# Patient Record
Sex: Female | Born: 1955 | Race: Black or African American | Hispanic: No | Marital: Married | Smoking: Current every day smoker
Health system: Southern US, Community
[De-identification: ages and names within clinical notes are randomized; demographics above are authoritative.]

## PROBLEM LIST (undated history)

## (undated) DIAGNOSIS — H409 Unspecified glaucoma: Secondary | ICD-10-CM

## (undated) DIAGNOSIS — H2013 Chronic iridocyclitis, bilateral: Secondary | ICD-10-CM

## (undated) DIAGNOSIS — K219 Gastro-esophageal reflux disease without esophagitis: Secondary | ICD-10-CM

## (undated) DIAGNOSIS — E119 Type 2 diabetes mellitus without complications: Secondary | ICD-10-CM

## (undated) DIAGNOSIS — E049 Nontoxic goiter, unspecified: Secondary | ICD-10-CM

## (undated) DIAGNOSIS — I10 Essential (primary) hypertension: Secondary | ICD-10-CM

## (undated) HISTORY — DX: Chronic iridocyclitis, bilateral: H20.13

## (undated) HISTORY — PX: NO PAST SURGERIES: SHX2092

## (undated) HISTORY — PX: COLONOSCOPY: SHX174

## (undated) HISTORY — DX: Type 2 diabetes mellitus without complications: E11.9

## (undated) HISTORY — DX: Unspecified glaucoma: H40.9

## (undated) HISTORY — PX: UPPER GASTROINTESTINAL ENDOSCOPY: SHX188

---

## 1999-05-14 ENCOUNTER — Encounter: Payer: Self-pay | Admitting: Internal Medicine

## 1999-05-14 ENCOUNTER — Ambulatory Visit (HOSPITAL_COMMUNITY): Admission: RE | Admit: 1999-05-14 | Discharge: 1999-05-14 | Payer: Self-pay | Admitting: Internal Medicine

## 1999-07-29 ENCOUNTER — Ambulatory Visit (HOSPITAL_COMMUNITY): Admission: RE | Admit: 1999-07-29 | Discharge: 1999-07-29 | Payer: Self-pay | Admitting: Internal Medicine

## 1999-07-29 ENCOUNTER — Encounter: Payer: Self-pay | Admitting: Internal Medicine

## 2000-09-09 ENCOUNTER — Other Ambulatory Visit: Admission: RE | Admit: 2000-09-09 | Discharge: 2000-09-09 | Payer: Self-pay | Admitting: Internal Medicine

## 2000-09-22 ENCOUNTER — Encounter: Payer: Self-pay | Admitting: Internal Medicine

## 2000-09-22 ENCOUNTER — Ambulatory Visit (HOSPITAL_COMMUNITY): Admission: RE | Admit: 2000-09-22 | Discharge: 2000-09-22 | Payer: Self-pay | Admitting: Internal Medicine

## 2002-11-12 ENCOUNTER — Emergency Department (HOSPITAL_COMMUNITY): Admission: EM | Admit: 2002-11-12 | Discharge: 2002-11-12 | Payer: Self-pay | Admitting: Emergency Medicine

## 2007-03-08 ENCOUNTER — Ambulatory Visit (HOSPITAL_COMMUNITY): Admission: RE | Admit: 2007-03-08 | Discharge: 2007-03-08 | Payer: Self-pay | Admitting: Internal Medicine

## 2008-03-20 ENCOUNTER — Ambulatory Visit (HOSPITAL_COMMUNITY): Admission: RE | Admit: 2008-03-20 | Discharge: 2008-03-20 | Payer: Self-pay | Admitting: Internal Medicine

## 2008-03-23 ENCOUNTER — Encounter: Admission: RE | Admit: 2008-03-23 | Discharge: 2008-03-23 | Payer: Self-pay | Admitting: Internal Medicine

## 2008-03-28 ENCOUNTER — Encounter (INDEPENDENT_AMBULATORY_CARE_PROVIDER_SITE_OTHER): Payer: Self-pay | Admitting: Diagnostic Radiology

## 2008-03-28 ENCOUNTER — Encounter: Admission: RE | Admit: 2008-03-28 | Discharge: 2008-03-28 | Payer: Self-pay | Admitting: Internal Medicine

## 2009-05-09 ENCOUNTER — Ambulatory Visit (HOSPITAL_COMMUNITY): Admission: RE | Admit: 2009-05-09 | Discharge: 2009-05-09 | Payer: Self-pay | Admitting: Internal Medicine

## 2009-11-23 IMAGING — MG MM DIAGNOSTIC LTD LEFT
3 series · 3 of 3 positions shown · non-contrast
Comparison: 03/20/2008, 03/08/2007

CLINICAL DATA: Screening callback for questioned left breast mass

DIGITAL DIAGNOSTIC  LEFT  MAMMOGRAM  WITHOUT CAD AND LEFT BREAST
ULTRASOUND:

[L CC (1 of 2)]
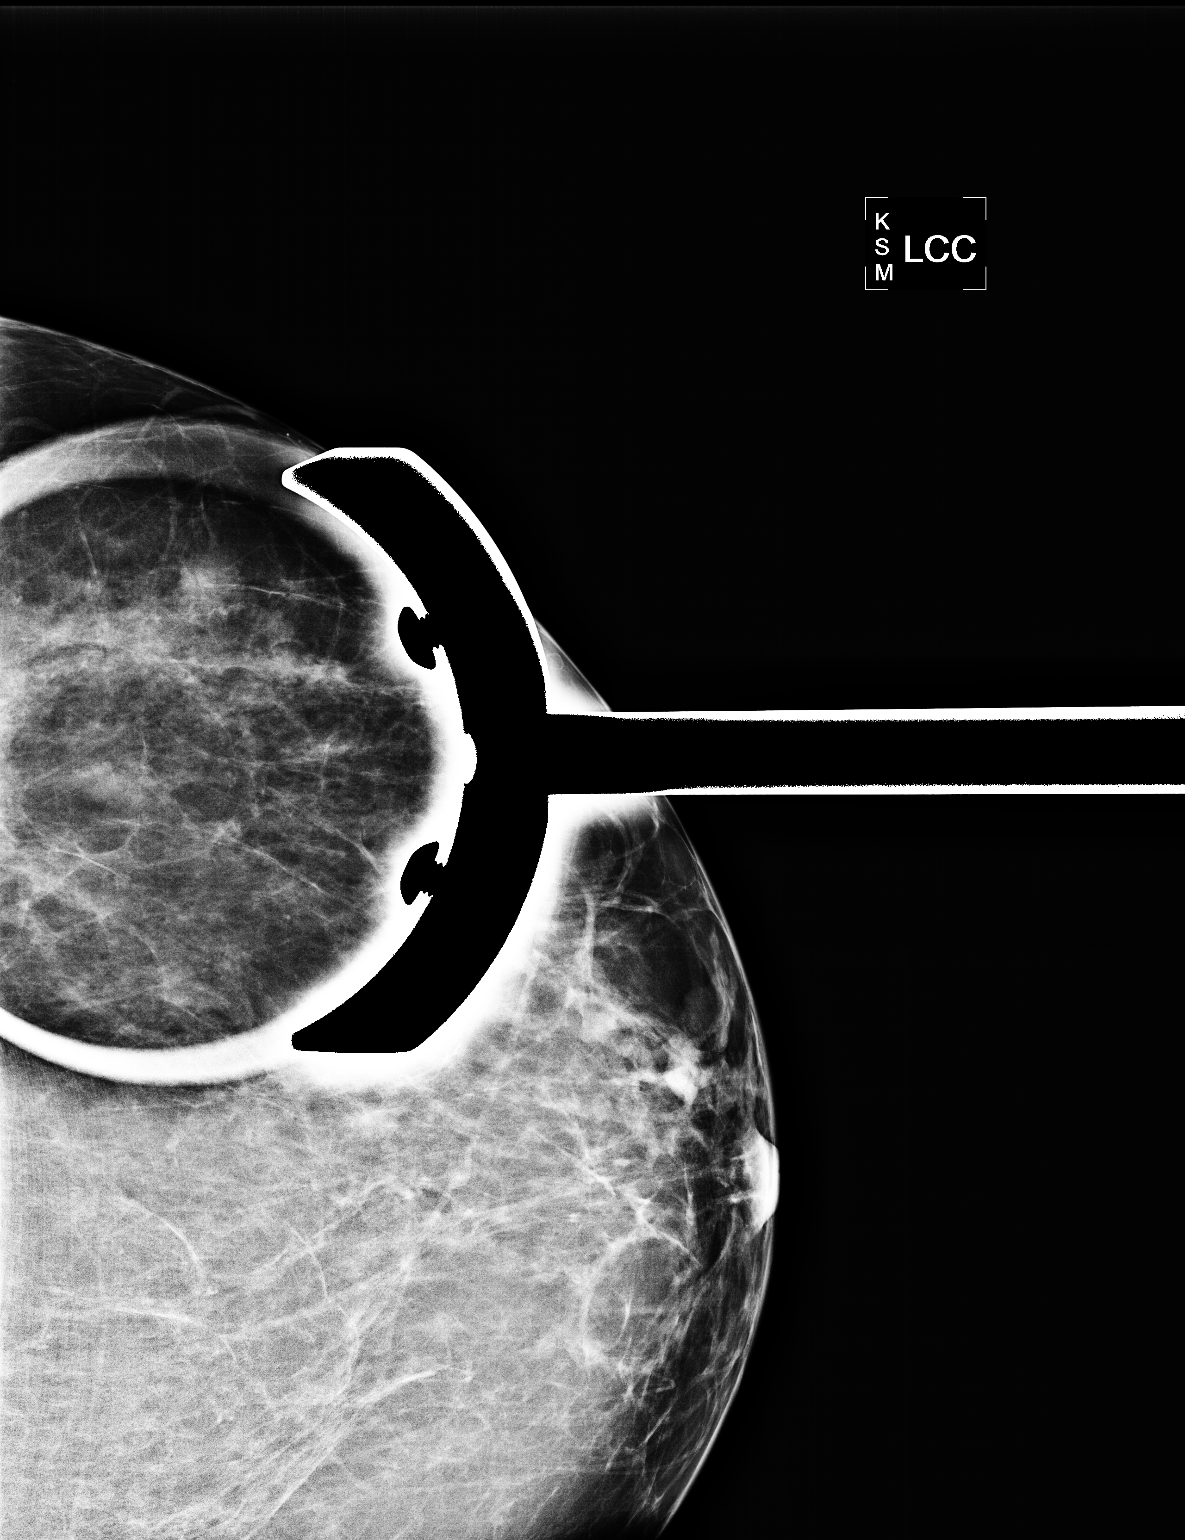

[L MLO]
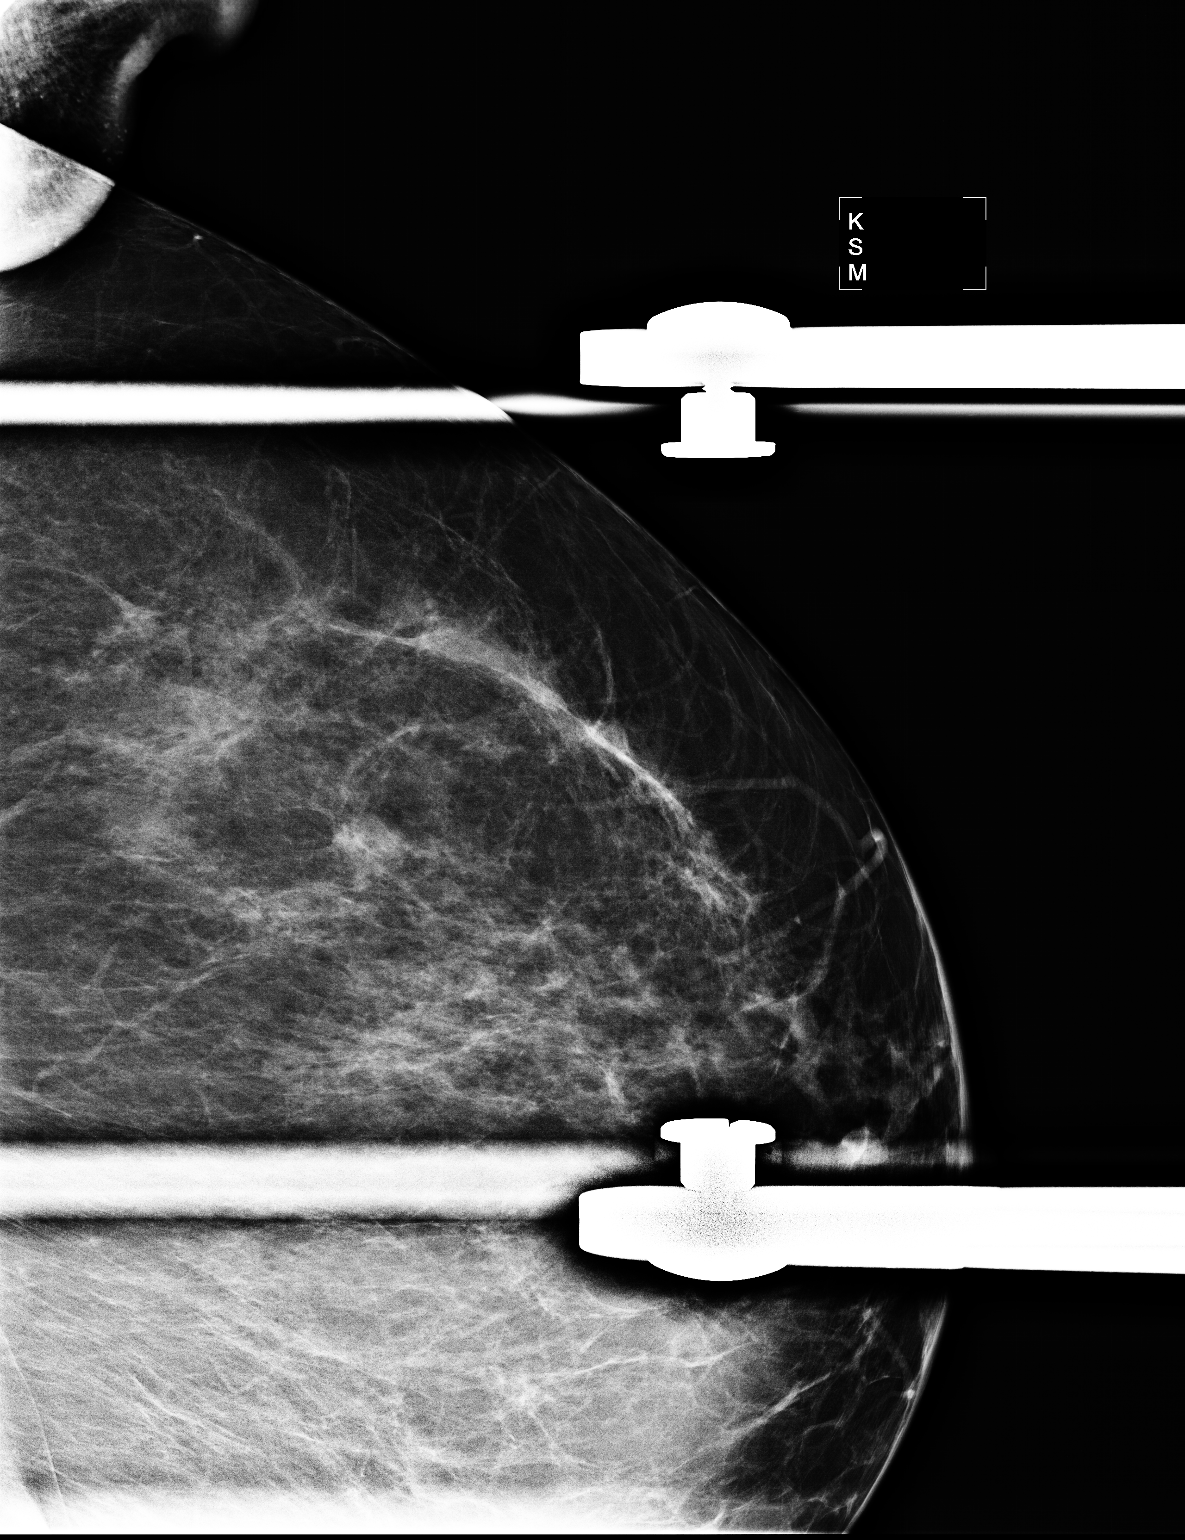

[L CC (2 of 2)]
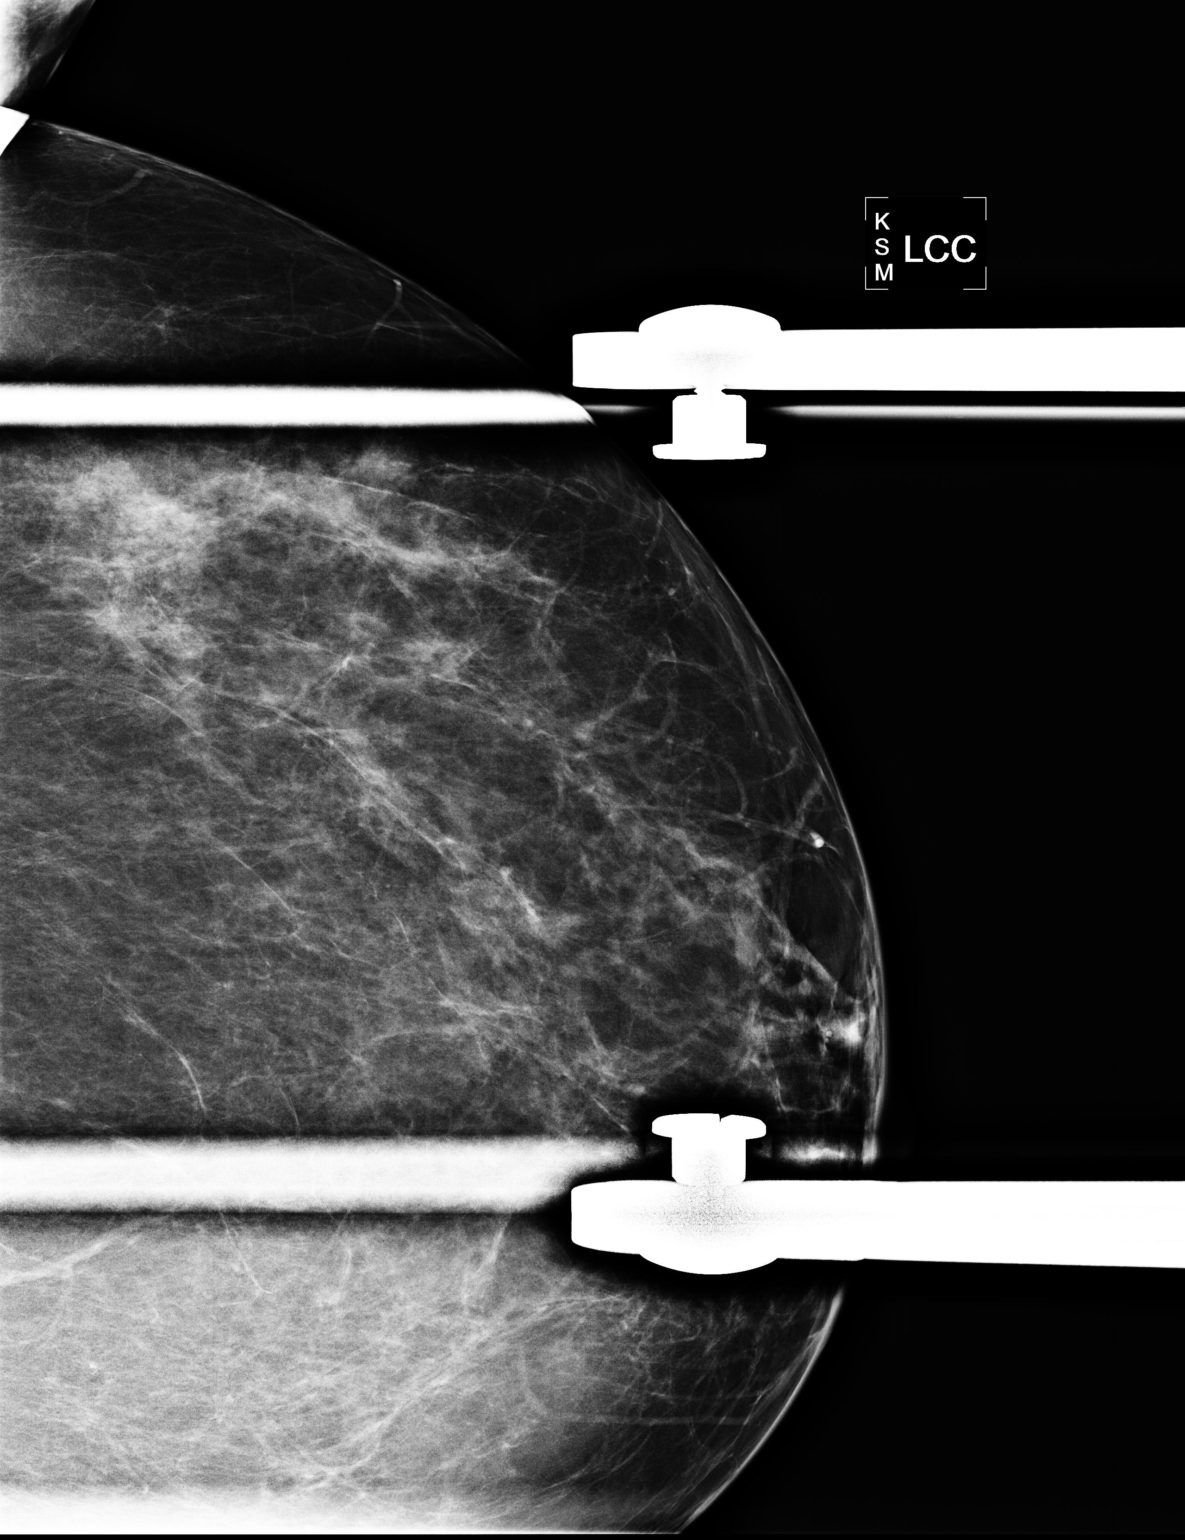

[3 of 3 positions shown; findings below may reference images not displayed]

FINDINGS: On additional imaging, the breast parenchyma in the left
upper outer quadrant is heterogeneously dense and nodular.  There
is an oval circumscribed mass in the left upper outer quadrant
which is stable over prior exams and may represent an intramammary
lymph node.

On physical exam, I palpate no abnormality in the left upper outer
quadrant.

Ultrasound is performed, showing a hypoechoic irregular mass with
slightly angulated margins in the left breast two o'clock location
6 cm from the nipple measuring 8 x 8 x 4 mm.
IMPRESSION: Suspicious left breast mass 2 o'clock location.  Although this may
not correspond to the mammographic abnormality, biopsy is warranted
for the sonographic finding and has been scheduled for 03/28/2008
due to time constraints today. Findings and recommendations
discussed with the patient and provided in written form at the time
of the exam.

BI-RADS CATEGORY 4:  Suspicious abnormality - biopsy should be
considered.

Recommendation:  Left breast biopsy

## 2010-11-13 ENCOUNTER — Other Ambulatory Visit: Payer: Self-pay | Admitting: Obstetrics and Gynecology

## 2010-11-26 ENCOUNTER — Other Ambulatory Visit: Payer: Self-pay

## 2010-11-26 ENCOUNTER — Encounter (HOSPITAL_COMMUNITY): Payer: Self-pay

## 2010-11-26 ENCOUNTER — Other Ambulatory Visit (HOSPITAL_COMMUNITY): Payer: Self-pay | Admitting: Obstetrics and Gynecology

## 2010-11-26 ENCOUNTER — Encounter (HOSPITAL_COMMUNITY)
Admission: RE | Admit: 2010-11-26 | Discharge: 2010-11-26 | Disposition: A | Discharge status: Home / Self Care | Payer: BC Managed Care – PPO | Source: Ambulatory Visit | Attending: Obstetrics and Gynecology | Admitting: Obstetrics and Gynecology

## 2010-11-26 DIAGNOSIS — Z1231 Encounter for screening mammogram for malignant neoplasm of breast: Secondary | ICD-10-CM

## 2010-11-26 HISTORY — DX: Essential (primary) hypertension: I10

## 2010-11-26 HISTORY — DX: Gastro-esophageal reflux disease without esophagitis: K21.9

## 2010-11-26 HISTORY — DX: Nontoxic goiter, unspecified: E04.9

## 2010-11-26 LAB — CBC
HCT: 40.3 % (ref 36.0–46.0)
Hemoglobin: 13.2 g/dL (ref 12.0–15.0)
MCH: 30.1 pg (ref 26.0–34.0)
MCHC: 32.8 g/dL (ref 30.0–36.0)
MCV: 92 fL (ref 78.0–100.0)
RBC: 4.38 MIL/uL (ref 3.87–5.11)

## 2010-11-26 LAB — BASIC METABOLIC PANEL
BUN: 12 mg/dL (ref 6–23)
CO2: 32 mEq/L (ref 19–32)
Chloride: 101 mEq/L (ref 96–112)
Glucose, Bld: 106 mg/dL — ABNORMAL HIGH (ref 70–99)
Potassium: 3.5 mEq/L (ref 3.5–5.1)
Sodium: 139 mEq/L (ref 135–145)

## 2010-11-26 NOTE — Patient Instructions (Signed)
   Your procedure is scheduled AV:[id number], 11/28/10  Enter through the Main Entrance of [facility] at:1130am Pick up the phone at the desk and dial [phone] and inform us of your arrival  Please call this number if you have any problems the morning of surgery: [phone]  Remember: Do not eat food after midnight  Do not drink clear liquids after:9am Fri Take these medicines the morning of surgery with a SIP OF WATER: BP pill  Do not wear jewelry, make-up, or FINGER nail polish Do not wear lotions, powders, or perfumes.  You may wear deodorant. Do not shave 48 hours prior to surgery. Do not bring valuables to the hospital. Leave suitcase in the car. After Surgery it may be brought to your room. For patients being admitted to the hospital, checkout time is 11:00am the day of discharge.  Patients discharged on the day of surgery will not be allowed to drive home.   Name and phone number of your driver: Christina Anthony- [phone]   Remember to use your hibiclens as instructed.Please shower with 1/2 bottle the evening before your surgery and the other 1/2 bottle the morning of surgery.

## 2010-11-28 ENCOUNTER — Encounter (HOSPITAL_COMMUNITY): Payer: Self-pay | Admitting: Anesthesiology

## 2010-11-28 ENCOUNTER — Ambulatory Visit (HOSPITAL_COMMUNITY): Payer: BC Managed Care – PPO | Admitting: Anesthesiology

## 2010-11-28 ENCOUNTER — Other Ambulatory Visit: Payer: Self-pay | Admitting: Obstetrics and Gynecology

## 2010-11-28 ENCOUNTER — Ambulatory Visit (HOSPITAL_COMMUNITY)
Admission: RE | Admit: 2010-11-28 | Discharge: 2010-11-28 | Disposition: A | Discharge status: Home / Self Care | Payer: BC Managed Care – PPO | Source: Ambulatory Visit | Attending: Obstetrics and Gynecology | Admitting: Obstetrics and Gynecology

## 2010-11-28 ENCOUNTER — Encounter (HOSPITAL_COMMUNITY)
Admission: RE | Disposition: A | Discharge status: Home / Self Care | Payer: Self-pay | Source: Ambulatory Visit | Attending: Obstetrics and Gynecology

## 2010-11-28 DIAGNOSIS — N840 Polyp of corpus uteri: Secondary | ICD-10-CM | POA: Diagnosis present

## 2010-11-28 DIAGNOSIS — Z01818 Encounter for other preprocedural examination: Secondary | ICD-10-CM | POA: Insufficient documentation

## 2010-11-28 DIAGNOSIS — Z01812 Encounter for preprocedural laboratory examination: Secondary | ICD-10-CM | POA: Insufficient documentation

## 2010-11-28 DIAGNOSIS — N950 Postmenopausal bleeding: Secondary | ICD-10-CM | POA: Diagnosis present

## 2010-11-28 SURGERY — DILATATION & CURETTAGE/HYSTEROSCOPY WITH RESECTOCOPE
Anesthesia: General | Site: Uterus | Wound class: Clean Contaminated

## 2010-11-28 MED ORDER — LIDOCAINE HCL (CARDIAC) 20 MG/ML IV SOLN
INTRAVENOUS | Status: AC
  Filled 2010-11-28: qty 5

## 2010-11-28 MED ORDER — MIDAZOLAM HCL 5 MG/5ML IJ SOLN
INTRAMUSCULAR | Status: DC | PRN
  Administered 2010-11-28: 2 mg via INTRAVENOUS

## 2010-11-28 MED ORDER — KETOROLAC TROMETHAMINE 30 MG/ML IJ SOLN
INTRAMUSCULAR | Status: AC
  Filled 2010-11-28: qty 1

## 2010-11-28 MED ORDER — KETOROLAC TROMETHAMINE 60 MG/2ML IM SOLN
INTRAMUSCULAR | Status: AC
  Filled 2010-11-28: qty 2

## 2010-11-28 MED ORDER — ONDANSETRON HCL 4 MG/2ML IJ SOLN: 4.0000 mg | Freq: Once | INTRAMUSCULAR | Status: DC | PRN

## 2010-11-28 MED ORDER — LACTATED RINGERS IV SOLN
INTRAVENOUS | Status: DC
  Administered 2010-11-28 (×2): via INTRAVENOUS

## 2010-11-28 MED ORDER — KETOROLAC TROMETHAMINE 30 MG/ML IJ SOLN
INTRAMUSCULAR | Status: DC | PRN
  Administered 2010-11-28: 30 mg via INTRAVENOUS

## 2010-11-28 MED ORDER — ONDANSETRON HCL 4 MG/2ML IJ SOLN
INTRAMUSCULAR | Status: AC
  Filled 2010-11-28: qty 2

## 2010-11-28 MED ORDER — GLYCINE 1.5 % IR SOLN
Status: DC | PRN
  Administered 2010-11-28: 3000 mL

## 2010-11-28 MED ORDER — KETOROLAC TROMETHAMINE 30 MG/ML IJ SOLN: 15.0000 mg | Freq: Once | INTRAMUSCULAR | Status: DC | PRN

## 2010-11-28 MED ORDER — CHLOROPROCAINE HCL 1 % IJ SOLN
INTRAMUSCULAR | Status: DC | PRN
  Administered 2010-11-28: 10 mL

## 2010-11-28 MED ORDER — PROPOFOL 10 MG/ML IV EMUL
INTRAVENOUS | Status: AC
  Filled 2010-11-28: qty 20

## 2010-11-28 MED ORDER — PROPOFOL 10 MG/ML IV EMUL
INTRAVENOUS | Status: DC | PRN
  Administered 2010-11-28: 200 mg via INTRAVENOUS

## 2010-11-28 MED ORDER — FENTANYL CITRATE 0.05 MG/ML IJ SOLN
INTRAMUSCULAR | Status: DC | PRN
  Administered 2010-11-28: 100 ug via INTRAVENOUS
  Administered 2010-11-28: 50 ug via INTRAVENOUS

## 2010-11-28 MED ORDER — LIDOCAINE HCL (CARDIAC) 20 MG/ML IV SOLN
INTRAVENOUS | Status: DC | PRN
  Administered 2010-11-28: 80 mg via INTRAVENOUS

## 2010-11-28 MED ORDER — KETOROLAC TROMETHAMINE 60 MG/2ML IM SOLN
INTRAMUSCULAR | Status: DC | PRN
  Administered 2010-11-28: 30 mg via INTRAMUSCULAR

## 2010-11-28 MED ORDER — MIDAZOLAM HCL 2 MG/2ML IJ SOLN
INTRAMUSCULAR | Status: AC
  Filled 2010-11-28: qty 2

## 2010-11-28 MED ORDER — MEPERIDINE HCL 25 MG/ML IJ SOLN: 6.2500 mg | INTRAMUSCULAR | Status: DC | PRN

## 2010-11-28 MED ORDER — ONDANSETRON HCL 4 MG/2ML IJ SOLN
INTRAMUSCULAR | Status: DC | PRN
  Administered 2010-11-28: 4 mg via INTRAVENOUS

## 2010-11-28 MED ORDER — FENTANYL CITRATE 0.05 MG/ML IJ SOLN: 25.0000 ug | INTRAMUSCULAR | Status: DC | PRN

## 2010-11-28 MED ORDER — FENTANYL CITRATE 0.05 MG/ML IJ SOLN
INTRAMUSCULAR | Status: AC
  Filled 2010-11-28: qty 5

## 2010-11-28 SURGICAL SUPPLY — 19 items
CANISTER SUCTION 2500CC (MISCELLANEOUS) ×2 IMPLANT
CATH ROBINSON RED A/P 16FR (CATHETERS) ×2 IMPLANT
CLOTH BEACON ORANGE TIMEOUT ST (SAFETY) ×2 IMPLANT
CONTAINER PREFILL 10% NBF 60ML (FORM) ×4 IMPLANT
DRAPE UTILITY XL STRL (DRAPES) ×4 IMPLANT
ELECT REM PT RETURN 9FT ADLT (ELECTROSURGICAL) ×2
ELECTRODE REM PT RTRN 9FT ADLT (ELECTROSURGICAL) ×1 IMPLANT
ELECTRODE ROLLER BARREL 22FR (ELECTROSURGICAL) ×2 IMPLANT
ELECTRODE ROLLER VERSAPOINT (ELECTRODE) IMPLANT
ELECTRODE RT ANGLE VERSAPOINT (CUTTING LOOP) IMPLANT
ELECTRODE VAPORCUT 22FR (ELECTROSURGICAL) IMPLANT
GLOVE BIO SURGEON STRL SZ 6.5 (GLOVE) ×2 IMPLANT
GLOVE BIOGEL PI IND STRL 7.0 (GLOVE) ×2 IMPLANT
GLOVE BIOGEL PI INDICATOR 7.0 (GLOVE) ×2
GOWN PREVENTION PLUS LG XLONG (DISPOSABLE) ×4 IMPLANT
LOOP ANGLED CUTTING 22FR (CUTTING LOOP) IMPLANT
PACK HYSTEROSCOPY LF (CUSTOM PROCEDURE TRAY) ×2 IMPLANT
TOWEL OR 17X24 6PK STRL BLUE (TOWEL DISPOSABLE) ×4 IMPLANT
WATER STERILE IRR 1000ML POUR (IV SOLUTION) ×2 IMPLANT

## 2010-11-28 NOTE — Anesthesia Preprocedure Evaluation (Signed)
Anesthesia Evaluation  Name, MR# and DOB Patient awake  General Assessment Comment  Reviewed: Allergy & Precautions, H&P , NPO status , Patient's Chart, lab work & pertinent test results  Airway Mallampati: II TM Distance: >3 FB Neck ROM: full    Dental No notable dental hx. (+) Teeth Intact   Pulmonary    pulmonary exam normalPulmonary Exam Normal     Cardiovascular hypertension, Pt. on medications     Neuro/Psych Negative Neurological ROS  Negative Psych ROS  GI/Hepatic/Renal        GERD      Endo/Other    Abdominal Normal abdominal exam  (+)   Musculoskeletal   Hematology   Peds  Reproductive/Obstetrics negative OB ROS    Anesthesia Other Findings             Anesthesia Physical Anesthesia Plan  ASA: II  Anesthesia Plan: General   Post-op Pain Management:    Induction: Intravenous  Airway Management Planned: LMA  Additional Equipment:   Intra-op Plan:   Post-operative Plan:   Informed Consent: I have reviewed the patients History and Physical, chart, labs and discussed the procedure including the risks, benefits and alternatives for the proposed anesthesia with the patient or authorized representative who has indicated his/her understanding and acceptance.     Plan Discussed with: CRNA  Anesthesia Plan Comments:         Anesthesia Quick Evaluation

## 2010-11-28 NOTE — Op Note (Signed)
Christina Anthony, Christina Anthony NO.:  [id number]  MEDICAL RECORD NO.:  [id number]  LOCATION:  [facility]                          FACILITY:  [facility]  PHYSICIAN:  Maxie Better, M.D.DATE OF BIRTH:  [birth year]  DATE OF PROCEDURE:  11/28/2010 DATE OF DISCHARGE:  11/28/2010                              OPERATIVE REPORT   PREOPERATIVE DIAGNOSES:  Postmenopausal bleeding, endometrial mass.  PROCEDURES:  Diagnostic hysteroscopy, hysteroscopic resection, endometrial polyp, dilation and curettage.  POSTOPERATIVE DIAGNOSES:  Postmenopausal bleeding, endometrial polyp.  ANESTHESIA:  General, paracervical block.  SURGEON:  Maxie Better, MD  ASSISTANT:  None.  PROCEDURE:  Under adequate general anesthesia, the patient was placed in the dorsal lithotomy position.  She was sterilely prepped and draped in the usual fashion.  A bladder was catheterized with small amount of urine.  Examination under anesthesia revealed a axial uterus with no adnexal masses appreciated.  Bivalve speculum was placed in the vagina. A 10 mL of 1% Nesacaine was injected paracervically at 3 and 9 o'clock. The anterior lip of the cervix was grasped with a single-tooth tenaculum.  The cervix was then serially dilated up to #29 [facility] dilator.  A resectoscope with a single loop was inserted into the uterine cavity.  A large polypoid lesion arising off the right lateral wall in the little bit posterior was debilitated and curled into the uterine cavity.  The careful dissection using coagulation was done and the polyp was removed in 2 pieces.  Once the polyp was removed, the cavity was inspected.  The left tubal ostia could be seen, but in the right, the endometrial cavity was thin.  There was a small endocervical polyp which was also removed.  The resectoscope was then removed.  The cavity was then curetted with scant amount of tissue.  All instruments were then removed from the vagina.  SPECIMENS:   Endometrial curetting and endometrial polyps sent to pathology.  ESTIMATED BLOOD LOSS:  Minimal.  FLUID DEFICIT:  200.  COMPLICATIONS:  None.  The patient tolerated the procedure well and was transferred to recovery in stable condition.     Maxie Better, M.D.     [place]/MEDQ  D:  11/28/2010  T:  11/28/2010  Job:  [id number]

## 2010-11-28 NOTE — Brief Op Note (Signed)
11/28/2010  1:37 PM  PATIENT:  Christina Anthony  55 y.o. female  PRE-OPERATIVE DIAGNOSIS:  Post Menopausal Bleeding, Endometrial Mass  POST-OPERATIVE DIAGNOSIS:  Post Menopausal Bleeding, Endometrial polyp  PROCEDURE:  Procedure(s): DILATATION & CURETTAGE/HYSTEROSCOPY WITH RESECTOSCOPE resection of endometrial polyps, D&C  SURGEON:  Surgeon(s): Serita Kyle, MD  PHYSICIAN ASSISTANT:   ASSISTANTS: none ANESTHESIA:   general and paracervical block  OR FLUID I/O:  Total I/O In: 1200 [I.V.:1200] Out: 75 [Urine:50; Blood:25]  BLOOD ADMINISTERED:none  DRAINS: none   LOCAL MEDICATIONS USED:  NONE  SPECIMEN:  Source of Specimen:  ECC, endom polyp  DISPOSITION OF SPECIMEN:  PATHOLOGY  COUNTS:  YES  TOURNIQUET:  * No tourniquets in log *  DICTATION: .Other Dictation: Dictation Number [id number]  PLAN OF CARE: Discharge to home after PACU  PATIENT DISPOSITION:  PACU - hemodynamically stable.   Delay start of Pharmacological VTE agent (>24hrs) due to surgical blood loss or risk of bleeding:  no

## 2010-11-28 NOTE — Transfer of Care (Signed)
Immediate Anesthesia Transfer of Care Note  Patient: Christina Anthony  Procedure(s) Performed:  DILATATION & CURETTAGE/HYSTEROSCOPY WITH RESECTOSCOPE - resection of endometrial polyp  Patient Location: PACU  Anesthesia Type: General  Level of Consciousness: awake, alert  and oriented  Airway & Oxygen Therapy: Patient Spontanous Breathing and Patient connected to nasal cannula oxygen  Post-op Assessment: Report given to PACU RN  Post vital signs: stable  Complications: No apparent anesthesia complications

## 2010-11-28 NOTE — H&P (Signed)
See scanned office H&P 

## 2010-11-28 NOTE — Anesthesia Postprocedure Evaluation (Signed)
Anesthesia Post Note  Patient: Christina Anthony  Procedure(s) Performed:  DILATATION & CURETTAGE/HYSTEROSCOPY WITH RESECTOSCOPE - resection of endometrial polyp  Anesthesia type: General  Patient location: PACU  Post pain: Pain level controlled  Post assessment: Post-op Vital signs reviewed  Last Vitals:  Filed Vitals:   11/28/10 1415  BP: 157/86  Pulse: 59  Temp:   Resp: 17    Post vital signs: Reviewed  Level of consciousness: sedated  Complications: No apparent anesthesia complicationsfj

## 2010-12-29 ENCOUNTER — Ambulatory Visit (HOSPITAL_COMMUNITY)
Admission: RE | Admit: 2010-12-29 | Discharge: 2010-12-29 | Disposition: A | Discharge status: Home / Self Care | Payer: BC Managed Care – PPO | Source: Ambulatory Visit | Attending: Obstetrics and Gynecology | Admitting: Obstetrics and Gynecology

## 2010-12-29 DIAGNOSIS — Z1231 Encounter for screening mammogram for malignant neoplasm of breast: Secondary | ICD-10-CM

## 2011-06-12 DIAGNOSIS — Z8719 Personal history of other diseases of the digestive system: Secondary | ICD-10-CM | POA: Insufficient documentation

## 2011-06-12 DIAGNOSIS — I10 Essential (primary) hypertension: Secondary | ICD-10-CM | POA: Insufficient documentation

## 2012-08-15 DIAGNOSIS — H21541 Posterior synechiae (iris), right eye: Secondary | ICD-10-CM | POA: Insufficient documentation

## 2012-10-06 DIAGNOSIS — Z9841 Cataract extraction status, right eye: Secondary | ICD-10-CM | POA: Insufficient documentation

## 2012-11-25 DIAGNOSIS — H401122 Primary open-angle glaucoma, left eye, moderate stage: Secondary | ICD-10-CM | POA: Insufficient documentation

## 2012-11-25 DIAGNOSIS — H401113 Primary open-angle glaucoma, right eye, severe stage: Secondary | ICD-10-CM | POA: Insufficient documentation

## 2013-01-25 DIAGNOSIS — E785 Hyperlipidemia, unspecified: Secondary | ICD-10-CM | POA: Insufficient documentation

## 2014-06-11 DIAGNOSIS — H2013 Chronic iridocyclitis, bilateral: Secondary | ICD-10-CM | POA: Insufficient documentation

## 2014-07-03 ENCOUNTER — Other Ambulatory Visit (HOSPITAL_COMMUNITY): Payer: Self-pay | Admitting: Obstetrics and Gynecology

## 2014-07-03 DIAGNOSIS — Z1231 Encounter for screening mammogram for malignant neoplasm of breast: Secondary | ICD-10-CM

## 2014-07-12 ENCOUNTER — Ambulatory Visit (HOSPITAL_COMMUNITY)
Admission: RE | Admit: 2014-07-12 | Discharge: 2014-07-12 | Disposition: A | Discharge status: Home / Self Care | Payer: BC Managed Care – PPO | Source: Ambulatory Visit | Attending: Obstetrics and Gynecology | Admitting: Obstetrics and Gynecology

## 2014-07-12 DIAGNOSIS — Z1231 Encounter for screening mammogram for malignant neoplasm of breast: Secondary | ICD-10-CM | POA: Diagnosis not present

## 2015-05-28 DIAGNOSIS — H2512 Age-related nuclear cataract, left eye: Secondary | ICD-10-CM | POA: Insufficient documentation

## 2015-05-28 DIAGNOSIS — H35033 Hypertensive retinopathy, bilateral: Secondary | ICD-10-CM | POA: Insufficient documentation

## 2015-05-28 DIAGNOSIS — H35371 Puckering of macula, right eye: Secondary | ICD-10-CM | POA: Insufficient documentation

## 2015-06-27 ENCOUNTER — Other Ambulatory Visit (HOSPITAL_COMMUNITY)
Admission: RE | Admit: 2015-06-27 | Discharge: 2015-06-27 | Disposition: A | Discharge status: Home / Self Care | Payer: BC Managed Care – PPO | Source: Ambulatory Visit | Attending: Family Medicine | Admitting: Family Medicine

## 2015-06-27 DIAGNOSIS — Z029 Encounter for administrative examinations, unspecified: Secondary | ICD-10-CM | POA: Diagnosis not present

## 2015-06-27 LAB — CBC WITH DIFFERENTIAL/PLATELET
Basophils Absolute: 0 10*3/uL (ref 0.0–0.1)
Basophils Relative: 0 %
EOS ABS: 0.2 10*3/uL (ref 0.0–0.7)
EOS PCT: 2 %
HCT: 38 % (ref 36.0–46.0)
Hemoglobin: 12.4 g/dL (ref 12.0–15.0)
LYMPHS ABS: 2.9 10*3/uL (ref 0.7–4.0)
Lymphocytes Relative: 32 %
MCH: 29.4 pg (ref 26.0–34.0)
MCHC: 32.6 g/dL (ref 30.0–36.0)
MCV: 90 fL (ref 78.0–100.0)
MONO ABS: 0.6 10*3/uL (ref 0.1–1.0)
MONOS PCT: 6 %
Neutro Abs: 5.3 10*3/uL (ref 1.7–7.7)
Neutrophils Relative %: 60 %
PLATELETS: 295 10*3/uL (ref 150–400)
RBC: 4.22 MIL/uL (ref 3.87–5.11)
RDW: 15.1 % (ref 11.5–15.5)
WBC: 8.9 10*3/uL (ref 4.0–10.5)

## 2015-06-27 LAB — COMPREHENSIVE METABOLIC PANEL
ALT: 26 U/L (ref 14–54)
ANION GAP: 9 (ref 5–15)
AST: 19 U/L (ref 15–41)
Albumin: 4.1 g/dL (ref 3.5–5.0)
Alkaline Phosphatase: 62 U/L (ref 38–126)
BUN: 12 mg/dL (ref 6–20)
CHLORIDE: 104 mmol/L (ref 101–111)
CO2: 28 mmol/L (ref 22–32)
Calcium: 9.9 mg/dL (ref 8.9–10.3)
Creatinine, Ser: 0.78 mg/dL (ref 0.44–1.00)
Glucose, Bld: 103 mg/dL — ABNORMAL HIGH (ref 65–99)
POTASSIUM: 3.3 mmol/L — AB (ref 3.5–5.1)
SODIUM: 141 mmol/L (ref 135–145)
Total Bilirubin: 0.4 mg/dL (ref 0.3–1.2)
Total Protein: 7.1 g/dL (ref 6.5–8.1)

## 2015-06-28 LAB — PROTEIN ELECTROPHORESIS, SERUM
A/G RATIO SPE: 1.1 (ref 0.7–1.7)
ALPHA-1-GLOBULIN: 0.3 g/dL (ref 0.0–0.4)
ALPHA-2-GLOBULIN: 0.8 g/dL (ref 0.4–1.0)
Albumin ELP: 3.7 g/dL (ref 2.9–4.4)
Beta Globulin: 1.2 g/dL (ref 0.7–1.3)
GLOBULIN, TOTAL: 3.3 g/dL (ref 2.2–3.9)
Gamma Globulin: 1.1 g/dL (ref 0.4–1.8)
TOTAL PROTEIN ELP: 7 g/dL (ref 6.0–8.5)

## 2015-06-28 LAB — HEPATITIS PANEL, ACUTE
HCV Ab: <0.1 s/co ratio (ref 0.0–0.9)
HEP B S AG: NEGATIVE
Hep A IgM: NEGATIVE
Hep B C IgM: NEGATIVE

## 2015-06-28 LAB — RHEUMATOID FACTOR: Rhuematoid fact SerPl-aCnc: <10 IU/mL (ref 0.0–13.9)

## 2015-06-28 LAB — CYCLIC CITRUL PEPTIDE ANTIBODY, IGG/IGA: CCP Antibodies IgG/IgA: 4 units (ref 0–19)

## 2015-06-28 LAB — HIV ANTIBODY (ROUTINE TESTING W REFLEX): HIV SCREEN 4TH GENERATION: NONREACTIVE

## 2015-06-28 LAB — B. BURGDORFI ANTIBODIES

## 2015-06-28 LAB — FLUORESCENT TREPONEMAL AB(FTA)-IGG-BLD: FLUORESCENT TREPONEMAL AB, IGG: NONREACTIVE

## 2015-06-28 LAB — RPR: RPR: NONREACTIVE

## 2015-06-28 LAB — ANGIOTENSIN CONVERTING ENZYME: ANGIOTENSIN-CONVERTING ENZYME: 25 U/L (ref 14–82)

## 2015-06-28 LAB — ANTI-DNA ANTIBODY, DOUBLE-STRANDED

## 2015-06-28 LAB — ANTINUCLEAR ANTIBODIES, IFA: ANTINUCLEAR ANTIBODIES, IFA: NEGATIVE

## 2015-07-02 LAB — QUANTIFERON IN TUBE
QFT TB AG MINUS NIL VALUE: 0.04 IU/mL
QUANTIFERON TB AG VALUE: 0.2 IU/mL
QUANTIFERON TB GOLD: NEGATIVE
Quantiferon Nil Value: 0.16 IU/mL

## 2015-07-02 LAB — QUANTIFERON TB GOLD ASSAY (BLOOD)

## 2015-07-05 LAB — HLA A AND B PHENOTYPE

## 2015-07-07 LAB — MISC LABCORP TEST (SEND OUT)
LABCORP TEST CODE: 990010
LabCorp test name: 510750

## 2015-09-10 DIAGNOSIS — H401133 Primary open-angle glaucoma, bilateral, severe stage: Secondary | ICD-10-CM | POA: Insufficient documentation

## 2015-09-26 ENCOUNTER — Other Ambulatory Visit: Payer: Self-pay | Admitting: Internal Medicine

## 2015-09-26 DIAGNOSIS — Z1231 Encounter for screening mammogram for malignant neoplasm of breast: Secondary | ICD-10-CM

## 2015-10-16 DIAGNOSIS — E049 Nontoxic goiter, unspecified: Secondary | ICD-10-CM | POA: Insufficient documentation

## 2015-10-17 ENCOUNTER — Ambulatory Visit
Admission: RE | Admit: 2015-10-17 | Discharge: 2015-10-17 | Disposition: A | Discharge status: Home / Self Care | Payer: BC Managed Care – PPO | Source: Ambulatory Visit | Attending: Internal Medicine | Admitting: Internal Medicine

## 2015-10-17 DIAGNOSIS — Z1231 Encounter for screening mammogram for malignant neoplasm of breast: Secondary | ICD-10-CM

## 2015-10-23 DIAGNOSIS — Z9842 Cataract extraction status, left eye: Secondary | ICD-10-CM | POA: Insufficient documentation

## 2015-11-05 DIAGNOSIS — Z961 Presence of intraocular lens: Secondary | ICD-10-CM | POA: Insufficient documentation

## 2016-02-11 DIAGNOSIS — Z79899 Other long term (current) drug therapy: Secondary | ICD-10-CM | POA: Insufficient documentation

## 2016-04-02 ENCOUNTER — Other Ambulatory Visit: Payer: Self-pay | Admitting: Internal Medicine

## 2016-04-02 DIAGNOSIS — E049 Nontoxic goiter, unspecified: Secondary | ICD-10-CM

## 2016-05-05 ENCOUNTER — Other Ambulatory Visit: Payer: Self-pay | Admitting: Internal Medicine

## 2016-05-05 DIAGNOSIS — Z1231 Encounter for screening mammogram for malignant neoplasm of breast: Secondary | ICD-10-CM

## 2016-05-26 ENCOUNTER — Ambulatory Visit
Admission: RE | Admit: 2016-05-26 | Discharge: 2016-05-26 | Disposition: A | Discharge status: Home / Self Care | Payer: BC Managed Care – PPO | Source: Ambulatory Visit | Attending: Internal Medicine | Admitting: Internal Medicine

## 2016-05-26 DIAGNOSIS — E049 Nontoxic goiter, unspecified: Secondary | ICD-10-CM

## 2016-06-05 ENCOUNTER — Other Ambulatory Visit: Payer: Self-pay | Admitting: Internal Medicine

## 2016-06-05 DIAGNOSIS — E041 Nontoxic single thyroid nodule: Secondary | ICD-10-CM

## 2016-10-20 ENCOUNTER — Ambulatory Visit
Admission: RE | Admit: 2016-10-20 | Discharge: 2016-10-20 | Disposition: A | Discharge status: Home / Self Care | Payer: BC Managed Care – PPO | Source: Ambulatory Visit | Attending: Internal Medicine | Admitting: Internal Medicine

## 2016-10-20 DIAGNOSIS — Z1231 Encounter for screening mammogram for malignant neoplasm of breast: Secondary | ICD-10-CM

## 2017-04-29 ENCOUNTER — Encounter: Payer: Self-pay | Admitting: Internal Medicine

## 2017-05-10 ENCOUNTER — Other Ambulatory Visit: Payer: Self-pay | Admitting: Internal Medicine

## 2017-05-10 DIAGNOSIS — E2839 Other primary ovarian failure: Secondary | ICD-10-CM

## 2017-05-10 DIAGNOSIS — Z139 Encounter for screening, unspecified: Secondary | ICD-10-CM

## 2017-10-21 ENCOUNTER — Ambulatory Visit
Admission: RE | Admit: 2017-10-21 | Discharge: 2017-10-21 | Disposition: A | Discharge status: Home / Self Care | Payer: BC Managed Care – PPO | Source: Ambulatory Visit | Attending: Internal Medicine | Admitting: Internal Medicine

## 2017-10-21 DIAGNOSIS — E2839 Other primary ovarian failure: Secondary | ICD-10-CM

## 2017-10-21 DIAGNOSIS — Z139 Encounter for screening, unspecified: Secondary | ICD-10-CM

## 2017-10-21 LAB — HM DEXA SCAN

## 2017-10-21 LAB — HM MAMMOGRAPHY

## 2018-01-06 ENCOUNTER — Other Ambulatory Visit: Payer: Self-pay | Admitting: Nurse Practitioner

## 2018-03-20 ENCOUNTER — Other Ambulatory Visit: Payer: Self-pay | Admitting: Internal Medicine

## 2018-05-10 ENCOUNTER — Encounter: Payer: Self-pay | Admitting: Internal Medicine

## 2018-05-10 ENCOUNTER — Ambulatory Visit (INDEPENDENT_AMBULATORY_CARE_PROVIDER_SITE_OTHER): Payer: BC Managed Care – PPO | Admitting: Internal Medicine

## 2018-05-10 VITALS — BP 120/84 | HR 97 | Temp 98.0°F | Ht 62.6 in | Wt 184.0 lb

## 2018-05-10 DIAGNOSIS — Z0000 Encounter for general adult medical examination without abnormal findings: Secondary | ICD-10-CM

## 2018-05-10 DIAGNOSIS — E6609 Other obesity due to excess calories: Secondary | ICD-10-CM

## 2018-05-10 DIAGNOSIS — Z6833 Body mass index (BMI) 33.0-33.9, adult: Secondary | ICD-10-CM

## 2018-05-10 DIAGNOSIS — I10 Essential (primary) hypertension: Secondary | ICD-10-CM

## 2018-05-10 DIAGNOSIS — F17210 Nicotine dependence, cigarettes, uncomplicated: Secondary | ICD-10-CM

## 2018-05-10 DIAGNOSIS — E049 Nontoxic goiter, unspecified: Secondary | ICD-10-CM

## 2018-05-10 DIAGNOSIS — E041 Nontoxic single thyroid nodule: Secondary | ICD-10-CM | POA: Diagnosis not present

## 2018-05-10 LAB — POCT URINALYSIS DIPSTICK
Bilirubin, UA: NEGATIVE
Glucose, UA: NEGATIVE
Ketones, UA: NEGATIVE
Nitrite, UA: NEGATIVE
Protein, UA: NEGATIVE
SPEC GRAV UA: 1.015 (ref 1.010–1.025)
Urobilinogen, UA: 0.2 E.U./dL
pH, UA: 7 (ref 5.0–8.0)

## 2018-05-10 LAB — POCT UA - MICROALBUMIN
Creatinine, POC: 50 mg/dL
MICROALBUMIN (UR) POC: 30 mg/L

## 2018-05-10 NOTE — Patient Instructions (Signed)

## 2018-05-10 NOTE — Progress Notes (Signed)
Subjective:     Patient ID: Christina Anthony , female    DOB: Mar 17, 1955 , 63 y.o.   MRN: [id number]   Chief Complaint  Patient presents with  . Annual Exam  . Hypertension    HPI  She is here today for a full physical examination. She is followed by GYN for her pelvic examinations.   Hypertension  This is a chronic problem. The current episode started more than 1 year ago. The problem has been gradually improving since onset. The problem is controlled. Pertinent negatives include no blurred vision, chest pain, palpitations or shortness of breath. Risk factors for coronary artery disease include post-menopausal state, sedentary lifestyle and obesity.   She reports compliance with meds.   Past Medical History:  Diagnosis Date  . Chronic uveitis of both eyes   . GERD (gastroesophageal reflux disease)    no meds currently  . Glaucoma (increased eye pressure)   . Goiter   . Hypertension      Family History  Problem Relation Age of Onset  . Diabetes Mother   . Hypertension Mother   . Glaucoma Mother   . Hypertension Father      Current Outpatient Medications:  .  brimonidine (ALPHAGAN) 0.2 % ophthalmic solution, , Disp: , Rfl:  .  cetirizine (ZYRTEC) 10 MG tablet, Take 10 mg by mouth daily.  , Disp: , Rfl:  .  Cholecalciferol (VITAMIN D3) 25 MCG (1000 UT) CAPS, Take by mouth., Disp: , Rfl:  .  cycloSPORINE modified (NEORAL) 50 MG capsule, , Disp: , Rfl:  .  dorzolamide-timolol (COSOPT) 22.3-6.8 MG/ML ophthalmic solution, Place 1 drop into both eyes 2 (two) times daily.  , Disp: , Rfl:  .  folic acid (FOLVITE) 1 MG tablet, , Disp: , Rfl:  .  ketorolac (ACULAR) 0.5 % ophthalmic solution, 1 drop., Disp: , Rfl:  .  latanoprost (XALATAN) 0.005 % ophthalmic solution, Place 1 drop into both eyes at bedtime.  , Disp: , Rfl:  .  methotrexate (RHEUMATREX) 2.5 MG tablet, TAKE 8 TABLETS (20 MG TOTAL) BY MOUTH ONCE A WEEK., Disp: , Rfl:  .  Multiple Vitamin (THERA) TABS, Take by mouth.,  Disp: , Rfl:  .  omeprazole (PRILOSEC) 20 MG capsule, Take by mouth., Disp: , Rfl:  .  pravastatin (PRAVACHOL) 40 MG tablet, TAKE 1 TABLET BY MOUTH EVERY DAY, Disp: 90 tablet, Rfl: 1 .  prednisoLONE acetate (PRED FORTE) 1 % ophthalmic suspension, 1 drop., Disp: , Rfl:  .  triamterene-hydrochlorothiazide (MAXZIDE-25) 37.5-25 MG tablet, TAKE 1 TABLET BY MOUTH EVERY DAY, Disp: 90 tablet, Rfl: 1   No Known Allergies     No LMP recorded. Patient is postmenopausal..  Negative for: breast discharge, breast lump(s), breast pain and breast self exam. Associated symptoms include abnormal vaginal bleeding. Pertinent negatives include abnormal bleeding (hematology), anxiety, decreased libido, depression, difficulty falling sleep, dyspareunia, history of infertility, nocturia, sexual dysfunction, sleep disturbances, urinary incontinence, urinary urgency, vaginal discharge and vaginal itching. Diet regular.The patient states her exercise level is  minimal.   . The patient's tobacco use is:  Social History   Tobacco Use  Smoking Status Current Every Day Smoker  . Packs/day: 0.25  . Years: 45.00  . Pack years: 11.25  . Types: Cigarettes  Smokeless Tobacco Never Used  . She has been exposed to passive smoke. The patient's alcohol use is:  Social History   Substance and Sexual Activity  Alcohol Use Yes  . Alcohol/week: 1.0 standard drinks  .  Types: 1 Cans of beer per week   Comment: daily   Review of Systems  Constitutional: Negative.   HENT: Negative.   Eyes: Negative.  Negative for blurred vision.  Respiratory: Negative.  Negative for shortness of breath.   Cardiovascular: Negative.  Negative for chest pain and palpitations.  Gastrointestinal: Negative.   Endocrine: Negative.   Genitourinary: Negative.   Musculoskeletal: Negative.   Skin: Negative.   Allergic/Immunologic: Negative.   Neurological: Negative.   Hematological: Negative.   Psychiatric/Behavioral: Negative.      Today's  Vitals   05/10/18 1021  BP: 120/84  Pulse: 97  Temp: 98 F (36.7 C)  TempSrc: Oral  Weight: 184 lb (83.5 kg)  Height: 5' 2.6" (1.59 m)   Body mass index is 33.01 kg/m.   Objective:  Physical Exam Vitals signs and nursing note reviewed.  Constitutional:      Appearance: Normal appearance.  HENT:     Head: Normocephalic and atraumatic.     Right Ear: Tympanic membrane, ear canal and external ear normal.     Left Ear: Tympanic membrane, ear canal and external ear normal.     Nose: Nose normal.     Mouth/Throat:     Mouth: Mucous membranes are moist.     Pharynx: Oropharynx is clear.  Eyes:     General:        Right eye: No foreign body or discharge.        Left eye: No foreign body.     Extraocular Movements: Extraocular movements intact.     Conjunctiva/sclera: Conjunctivae normal.     Pupils: Pupils are equal, round, and reactive to light.     Comments: R irregular pupil, evidence of previous eye surgery present  Neck:     Musculoskeletal: Normal range of motion and neck supple.     Thyroid: Thyromegaly present. No thyroid tenderness.  Cardiovascular:     Rate and Rhythm: Normal rate and regular rhythm.     Pulses: Normal pulses.     Heart sounds: Normal heart sounds.  Pulmonary:     Effort: Pulmonary effort is normal.     Breath sounds: Normal breath sounds.  Chest:     Breasts:        Right: Normal. No swelling, bleeding, inverted nipple, mass or nipple discharge.        Left: Normal. No swelling, bleeding, inverted nipple, mass or nipple discharge.  Abdominal:     General: Abdomen is flat. Bowel sounds are normal.     Palpations: Abdomen is soft.  Genitourinary:    Comments: deferred Musculoskeletal: Normal range of motion.  Skin:    General: Skin is warm and dry.  Neurological:     General: No focal deficit present.     Mental Status: She is alert and oriented to person, place, and time.  Psychiatric:        Mood and Affect: Mood normal.         Behavior: Behavior normal.         Assessment And Plan:     1. Routine general medical examination at health care facility  A full exam was performed.  Importance of monthly self breast exams was discussed with the patient. PATIENT HAS BEEN ADVISED TO GET 30-45 MINUTES REGULAR EXERCISE NO LESS THAN FOUR TO FIVE DAYS PER WEEK - BOTH WEIGHTBEARING EXERCISES AND AEROBIC ARE RECOMMENDED.  SHE IS ADVISED TO FOLLOW A HEALTHY DIET WITH AT LEAST SIX FRUITS/VEGGIES PER DAY, DECREASE INTAKE  OF RED MEAT, AND TO INCREASE FISH INTAKE TO TWO DAYS PER WEEK.  MEATS/FISH SHOULD NOT BE FRIED, BAKED OR BROILED IS PREFERABLE.  I SUGGEST WEARING SPF 50 SUNSCREEN ON EXPOSED PARTS AND ESPECIALLY WHEN IN THE DIRECT SUNLIGHT FOR AN EXTENDED PERIOD OF TIME.  PLEASE AVOID FAST FOOD RESTAURANTS AND INCREASE YOUR WATER INTAKE.  - CMP14+EGFR - CBC - Lipid panel - Hemoglobin A1c  2. Essential hypertension, benign  Well controlled. She will continue with current meds. She is encouraged to avoid adding salt to her foods. EKG performed, NSR w/o acute changes noted. She will rto in six months for re-evaluation.   - EKG 12-Lead  3. Goiter  I will check thyroid panel.   - TSH  4. Thyroid nodule  I will refer her for thyroid ultrasound. Previous ultrasounds reviewed in full detail.   - US Soft Tissue Head/Neck; Future  5. Cigarette nicotine dependence without complication  Importance of smoking cessation was discussed with the patient for greater than 3 minutes. She does not wish to take any medication at this time. She is encouraged to decrease number of cigs smoked per day. She was also encouraged to avoid smoking both in her car and in her home.   6. Class 1 obesity due to excess calories with serious comorbidity and body mass index (BMI) of 33.0 to 33.9 in adult  Importance of achieving optimal weight to decrease risk of cardiovascular disease and cancers was discussed with the patient in full detail. She is  encouraged to start slowly - start with 10 minutes twice daily at least three to four days per week and to gradually build to 30 minutes five days weekly. She was given tips to incorporate more activity into her daily routine - take stairs when possible, park farther away from her job, grocery stores, etc. .     [name], MD

## 2018-05-11 LAB — CBC
Hematocrit: 36.1 % (ref 34.0–46.6)
Hemoglobin: 12.5 g/dL (ref 11.1–15.9)
MCH: 32.1 pg (ref 26.6–33.0)
MCHC: 34.6 g/dL (ref 31.5–35.7)
MCV: 93 fL (ref 79–97)
PLATELETS: 385 10*3/uL (ref 150–450)
RBC: 3.89 x10E6/uL (ref 3.77–5.28)
RDW: 14.6 % (ref 11.7–15.4)
WBC: 7.1 10*3/uL (ref 3.4–10.8)

## 2018-05-11 LAB — TSH: TSH: 0.196 u[IU]/mL — ABNORMAL LOW (ref 0.450–4.500)

## 2018-05-11 LAB — HEMOGLOBIN A1C
Est. average glucose Bld gHb Est-mCnc: 126 mg/dL
Hgb A1c MFr Bld: 6 % — ABNORMAL HIGH (ref 4.8–5.6)

## 2018-05-11 LAB — CMP14+EGFR
ALT: 35 IU/L — ABNORMAL HIGH (ref 0–32)
AST: 28 IU/L (ref 0–40)
Albumin/Globulin Ratio: 2 (ref 1.2–2.2)
Albumin: 4.5 g/dL (ref 3.8–4.8)
Alkaline Phosphatase: 76 IU/L (ref 39–117)
BUN/Creatinine Ratio: 13 (ref 12–28)
BUN: 12 mg/dL (ref 8–27)
Bilirubin Total: 0.3 mg/dL (ref 0.0–1.2)
CO2: 22 mmol/L (ref 20–29)
Calcium: 9.7 mg/dL (ref 8.7–10.3)
Chloride: 104 mmol/L (ref 96–106)
Creatinine, Ser: 0.92 mg/dL (ref 0.57–1.00)
GFR calc Af Amer: 78 mL/min/{1.73_m2} (ref >59)
GFR calc non Af Amer: 67 mL/min/{1.73_m2} (ref >59)
GLOBULIN, TOTAL: 2.3 g/dL (ref 1.5–4.5)
Glucose: 82 mg/dL (ref 65–99)
Potassium: 4.2 mmol/L (ref 3.5–5.2)
Sodium: 142 mmol/L (ref 134–144)
Total Protein: 6.8 g/dL (ref 6.0–8.5)

## 2018-05-11 LAB — LIPID PANEL
Chol/HDL Ratio: 4.3 ratio (ref 0.0–4.4)
Cholesterol, Total: 186 mg/dL (ref 100–199)
HDL: 43 mg/dL (ref >39)
LDL Calculated: 120 mg/dL — ABNORMAL HIGH (ref 0–99)
Triglycerides: 116 mg/dL (ref 0–149)
VLDL Cholesterol Cal: 23 mg/dL (ref 5–40)

## 2018-05-16 ENCOUNTER — Telehealth: Payer: Self-pay

## 2018-05-16 NOTE — Telephone Encounter (Signed)
-----   Message from Glendale Chard, MD sent at 05/11/2018 11:16 PM EDT ----- Your kidney fxn is stable. Your liver enzymes are sl. Elevated. Your blood count is nl. Your LDL, bad chol is 120. Ideally, less than 100.  This will improve as you incorporate more exercise into her daily routine. Your hba1c is 6.0, in prediabetes range. TSH abnormal, additional thyroid labs pending.

## 2018-05-16 NOTE — Telephone Encounter (Signed)
Left the patient a message to call back for lab results. 

## 2018-05-20 LAB — SPECIMEN STATUS REPORT

## 2018-05-20 LAB — T3, FREE: T3, Free: 3.3 pg/mL (ref 2.0–4.4)

## 2018-05-20 LAB — T4, FREE: Free T4: 1.68 ng/dL (ref 0.82–1.77)

## 2018-05-24 ENCOUNTER — Other Ambulatory Visit: Payer: Self-pay

## 2018-05-24 ENCOUNTER — Ambulatory Visit
Admission: RE | Admit: 2018-05-24 | Discharge: 2018-05-24 | Disposition: A | Discharge status: Home / Self Care | Payer: BC Managed Care – PPO | Source: Ambulatory Visit | Attending: Internal Medicine | Admitting: Internal Medicine

## 2018-05-24 DIAGNOSIS — E041 Nontoxic single thyroid nodule: Secondary | ICD-10-CM

## 2018-06-01 ENCOUNTER — Other Ambulatory Visit: Payer: Self-pay

## 2018-06-01 DIAGNOSIS — E041 Nontoxic single thyroid nodule: Secondary | ICD-10-CM

## 2018-06-02 ENCOUNTER — Other Ambulatory Visit: Payer: Self-pay | Admitting: Internal Medicine

## 2018-06-02 DIAGNOSIS — E041 Nontoxic single thyroid nodule: Secondary | ICD-10-CM

## 2018-06-03 ENCOUNTER — Encounter: Payer: Self-pay | Admitting: Internal Medicine

## 2018-06-11 ENCOUNTER — Encounter: Payer: Self-pay | Admitting: Internal Medicine

## 2018-08-02 ENCOUNTER — Other Ambulatory Visit: Payer: BC Managed Care – PPO

## 2018-09-12 ENCOUNTER — Other Ambulatory Visit: Payer: Self-pay | Admitting: Internal Medicine

## 2018-11-10 ENCOUNTER — Ambulatory Visit (INDEPENDENT_AMBULATORY_CARE_PROVIDER_SITE_OTHER): Payer: BC Managed Care – PPO | Admitting: Internal Medicine

## 2018-11-10 ENCOUNTER — Encounter: Payer: Self-pay | Admitting: Internal Medicine

## 2018-11-10 VITALS — BP 140/88 | HR 81 | Temp 98.3°F | Ht 62.4 in | Wt 190.2 lb

## 2018-11-10 DIAGNOSIS — Z23 Encounter for immunization: Secondary | ICD-10-CM

## 2018-11-10 DIAGNOSIS — E6609 Other obesity due to excess calories: Secondary | ICD-10-CM

## 2018-11-10 DIAGNOSIS — E042 Nontoxic multinodular goiter: Secondary | ICD-10-CM

## 2018-11-10 DIAGNOSIS — Z716 Tobacco abuse counseling: Secondary | ICD-10-CM

## 2018-11-10 DIAGNOSIS — R7309 Other abnormal glucose: Secondary | ICD-10-CM

## 2018-11-10 DIAGNOSIS — Z6834 Body mass index (BMI) 34.0-34.9, adult: Secondary | ICD-10-CM

## 2018-11-10 DIAGNOSIS — I10 Essential (primary) hypertension: Secondary | ICD-10-CM | POA: Diagnosis not present

## 2018-11-10 NOTE — Patient Instructions (Signed)
Thyroid Nodule  A thyroid nodule is an isolated growth of thyroid cells that forms a lump in your thyroid gland. The thyroid gland is a butterfly-shaped gland. It is found in the lower front of your neck. This gland sends chemical messengers (hormones) through your blood to all parts of your body. These hormones are important in regulating your body temperature and helping your body to use energy. Thyroid nodules are common. Most are not cancerous (benign). You may have one nodule or several nodules. Different types of thyroid nodules include nodules that:  Grow and fill with fluid (thyroid cysts).  Produce too much thyroid hormone (hot nodules or hyperthyroid).  Produce no thyroid hormone (cold nodules or hypothyroid).  Form from cancer cells (thyroid cancers). What are the causes? In most cases, the cause of this condition is not known. What increases the risk? The following factors may make you more likely to develop this condition.  Age. Thyroid nodules become more common in people who are older than 62 years of age.  Gender. ? Benign thyroid nodules are more common in women. ? Cancerous (malignant) thyroid nodules are more common in men.  A family history that includes: ? Thyroid nodules. ? Pheochromocytoma. ? Thyroid carcinoma. ? Hyperparathyroidism.  Certain kinds of thyroid diseases, such as Hashimoto's thyroiditis.  Lack of iodine in your diet.  A history of head and neck radiation, such as from previous cancer treatment. What are the signs or symptoms? In many cases, there are no symptoms. If you have symptoms, they may include:  A lump in your lower neck.  Feeling a lump or tickle in your throat.  Pain in your neck, jaw, or ear.  Having trouble swallowing. Hot nodules may cause symptoms that include:  Weight loss.  Warm, flushed skin.  Feeling hot.  Feeling nervous.  A racing heartbeat. Cold nodules may cause symptoms that include:  Weight gain.   Dry skin.  Brittle hair. This may also occur with hair loss.  Feeling cold.  Fatigue. Thyroid cancer nodules may cause symptoms that include:  Hard nodules that feel stuck to the thyroid gland.  Hoarseness.  Lumps in the glands near your thyroid (lymph nodes). How is this diagnosed? A thyroid nodule may be felt by your health care provider during a physical exam. This condition may also be diagnosed based on your symptoms. You may also have tests, including:  An ultrasound. This may be done to confirm the diagnosis.  A biopsy. This involves taking a sample from the nodule and looking at it under a microscope.  Blood tests to make sure that your thyroid is working properly.  A thyroid scan. This test uses a radioactive tracer injected into a vein to create an image of the thyroid gland on a computer screen.  Imaging tests such as MRI or CT scan. These may be done if: ? Your nodule is large. ? Your nodule is blocking your airway. ? Cancer is suspected. How is this treated? Treatment depends on the cause and size of your nodule or nodules. If the nodule is benign, treatment may not be necessary. Your health care provider may monitor the nodule to see if it goes away without treatment. If the nodule continues to grow, is cancerous, or does not go away, treatment may be needed. Treatment may include:  Having a cystic nodule drained with a needle.  Ablation therapy. In this treatment, alcohol is injected into the area of the nodule to destroy the cells. Ablation with heat (  thermal ablation) may also be used.  Radioactive iodine. In this treatment, radioactive iodine is given as a pill or liquid that you drink. This substance causes the thyroid nodule to shrink.  Surgery to remove the nodule. Part or all of your thyroid gland may need to be removed as well.  Medicines. Follow these instructions at home:  Pay attention to any changes in your nodule.  Take over-the-counter and  prescription medicines only as told by your health care provider.  Keep all follow-up visits as told by your health care provider. This is important. Contact a health care provider if:  Your voice changes.  You have trouble swallowing.  You have pain in your neck, ear, or jaw that is getting worse.  Your nodule gets bigger.  Your nodule starts to make it harder for you to breathe.  Your muscles look like they are shrinking (muscle wasting). Get help right away if:  You have chest pain.  There is a loss of consciousness.  You have a sudden fever.  You feel confused.  You are seeing or hearing things that other people do not see or hear (having hallucinations).  You feel very weak.  You have mood swings.  You feel very restless.  You feel suddenly nauseous or throw up.  You suddenly have diarrhea. Summary  A thyroid nodule is an isolated growth of thyroid cells that forms a lump in your thyroid gland.  Thyroid nodules are common. Most are not cancerous (benign). You may have one nodule or several nodules.  Treatment depends on the cause and size of your nodule or nodules. If the nodule is benign, treatment may not be necessary.  Your health care provider may monitor the nodule to see if it goes away without treatment. If the nodule continues to grow, is cancerous, or does not go away, treatment may be needed. This information is not intended to replace advice given to you by your health care provider. Make sure you discuss any questions you have with your health care provider. Document Released: 01/10/2004 Document Revised: 10/01/2017 Document Reviewed: 10/04/2017 Elsevier Patient Education  2020 [employer]  

## 2018-11-11 ENCOUNTER — Other Ambulatory Visit: Payer: Self-pay

## 2018-11-11 LAB — BMP8+EGFR
BUN/Creatinine Ratio: 13 (ref 12–28)
BUN: 12 mg/dL (ref 8–27)
CO2: 27 mmol/L (ref 20–29)
Calcium: 10.4 mg/dL — ABNORMAL HIGH (ref 8.7–10.3)
Chloride: 103 mmol/L (ref 96–106)
Creatinine, Ser: 0.91 mg/dL (ref 0.57–1.00)
GFR calc Af Amer: 79 mL/min/{1.73_m2} (ref >59)
GFR calc non Af Amer: 68 mL/min/{1.73_m2} (ref >59)
Glucose: 80 mg/dL (ref 65–99)
Potassium: 4.6 mmol/L (ref 3.5–5.2)
Sodium: 140 mmol/L (ref 134–144)

## 2018-11-11 LAB — TSH: TSH: 0.085 u[IU]/mL — ABNORMAL LOW (ref 0.450–4.500)

## 2018-11-11 LAB — HEMOGLOBIN A1C
Est. average glucose Bld gHb Est-mCnc: 128 mg/dL
Hgb A1c MFr Bld: 6.1 % — ABNORMAL HIGH (ref 4.8–5.6)

## 2018-11-13 NOTE — Progress Notes (Signed)
Subjective:     Patient ID: Christina Anthony , female    DOB: February 12, 1956 , 63 y.o.   MRN: [id number]   Chief Complaint  Patient presents with  . Hypertension    HPI  Hypertension This is a chronic problem. The current episode started more than 1 year ago. The problem has been gradually improving since onset. The problem is controlled. Pertinent negatives include no blurred vision, chest pain, palpitations or shortness of breath. Risk factors for coronary artery disease include post-menopausal state and sedentary lifestyle. Past treatments include diuretics. The current treatment provides moderate improvement. Compliance problems include exercise.      Past Medical History:  Diagnosis Date  . Chronic uveitis of both eyes   . GERD (gastroesophageal reflux disease)    no meds currently  . Glaucoma (increased eye pressure)   . Goiter   . Hypertension      Family History  Problem Relation Age of Onset  . Diabetes Mother   . Hypertension Mother   . Glaucoma Mother   . Hypertension Father      Current Outpatient Medications:  .  brimonidine (ALPHAGAN) 0.2 % ophthalmic solution, , Disp: , Rfl:  .  cetirizine (ZYRTEC) 10 MG tablet, Take 10 mg by mouth daily.  , Disp: , Rfl:  .  Cholecalciferol (VITAMIN D3) 25 MCG (1000 UT) CAPS, Take by mouth., Disp: , Rfl:  .  cycloSPORINE modified (NEORAL) 50 MG capsule, , Disp: , Rfl:  .  dorzolamide-timolol (COSOPT) 22.3-6.8 MG/ML ophthalmic solution, Place 1 drop into both eyes 2 (two) times daily.  , Disp: , Rfl:  .  folic acid (FOLVITE) 1 MG tablet, , Disp: , Rfl:  .  ketorolac (ACULAR) 0.5 % ophthalmic solution, 1 drop., Disp: , Rfl:  .  latanoprost (XALATAN) 0.005 % ophthalmic solution, Place 1 drop into both eyes at bedtime.  , Disp: , Rfl:  .  methotrexate (RHEUMATREX) 2.5 MG tablet, TAKE 8 TABLETS (20 MG TOTAL) BY MOUTH ONCE A WEEK., Disp: , Rfl:  .  Multiple Vitamin (THERA) TABS, Take by mouth., Disp: , Rfl:  .  omeprazole (PRILOSEC)  20 MG capsule, Take by mouth., Disp: , Rfl:  .  pravastatin (PRAVACHOL) 40 MG tablet, TAKE 1 TABLET BY MOUTH EVERY DAY, Disp: 90 tablet, Rfl: 1 .  prednisoLONE acetate (PRED FORTE) 1 % ophthalmic suspension, 1 drop., Disp: , Rfl:  .  triamterene-hydrochlorothiazide (MAXZIDE-25) 37.5-25 MG tablet, TAKE 1 TABLET BY MOUTH EVERY DAY, Disp: 90 tablet, Rfl: 1   No Known Allergies   Review of Systems  Constitutional: Negative.   Eyes: Negative for blurred vision.  Respiratory: Negative.  Negative for shortness of breath.   Cardiovascular: Negative.  Negative for chest pain and palpitations.  Gastrointestinal: Negative.   Neurological: Negative.   Psychiatric/Behavioral: Negative.      Today's Vitals   11/10/18 1032  BP: 140/88  Pulse: 81  Temp: 98.3 F (36.8 C)  TempSrc: Oral  SpO2: 98%  Weight: 190 lb 3.2 oz (86.3 kg)  Height: 5' 2.4" (1.585 m)   Body mass index is 34.34 kg/m.   Objective:  Physical Exam Vitals signs and nursing note reviewed.  Constitutional:      Appearance: Normal appearance.  HENT:     Head: Normocephalic and atraumatic.  Neck:     Thyroid: Thyromegaly present.  Cardiovascular:     Rate and Rhythm: Normal rate and regular rhythm.     Heart sounds: Normal heart sounds.  Pulmonary:  Effort: Pulmonary effort is normal.     Breath sounds: Normal breath sounds.  Skin:    General: Skin is warm.  Neurological:     General: No focal deficit present.     Mental Status: She is alert.  Psychiatric:        Mood and Affect: Mood normal.        Behavior: Behavior normal.         Assessment And Plan:     1. Essential hypertension, benign  Chronic, fair control. She will continue with current meds. She is encouraged to avoid adding salt to her foods. I will check renal function today.   - BMP8+EGFR  2. Multinodular goiter  Chronic.  Most recent thyroid u/s results were reviewed. I will check TSh today.  - TSH  3. Other abnormal glucose  HER  A1C HAS BEEN ELEVATED IN THE PAST. I WILL CHECK AN A1C, BMET TODAY. SHE WAS ENCOURAGED TO AVOID SUGARY BEVERAGES AND PROCESSED FOODS INCLUDNG BREADS, RICE AND PASTA.  - Hemoglobin A1c  4. Need for influenza vaccination  - Flu Vaccine QUAD 6+ mos PF IM (Fluarix Quad PF)  5. Class 1 obesity due to excess calories with serious comorbidity and body mass index (BMI) of 34.0 to 34.9 in adult  She is encouraged to strive for BMI less than 30 to decrease cardiac risk. She is encouraged to exercise no less than 30 minutes five days weekly.   6. Tobacco abuse counseling  Importance of smoking cessation was discussed with the patient for greater than 3 minutes. She does not wish to quit at this time. She reports cutting back on number of cigs smoked per day. She is encouraged to avoid smoking both in her car and in her home.   [name], MD    THE PATIENT IS ENCOURAGED TO PRACTICE SOCIAL DISTANCING DUE TO THE COVID-19 PANDEMIC.

## 2018-11-15 ENCOUNTER — Other Ambulatory Visit: Payer: Self-pay | Admitting: Internal Medicine

## 2018-11-15 DIAGNOSIS — Z1231 Encounter for screening mammogram for malignant neoplasm of breast: Secondary | ICD-10-CM

## 2018-11-16 ENCOUNTER — Other Ambulatory Visit: Payer: Self-pay

## 2018-11-16 ENCOUNTER — Ambulatory Visit
Admission: RE | Admit: 2018-11-16 | Discharge: 2018-11-16 | Disposition: A | Discharge status: Home / Self Care | Payer: BC Managed Care – PPO | Source: Ambulatory Visit | Attending: Internal Medicine | Admitting: Internal Medicine

## 2018-11-16 DIAGNOSIS — Z1231 Encounter for screening mammogram for malignant neoplasm of breast: Secondary | ICD-10-CM

## 2019-02-04 ENCOUNTER — Other Ambulatory Visit: Payer: Self-pay | Admitting: Internal Medicine

## 2019-05-15 ENCOUNTER — Other Ambulatory Visit: Payer: Self-pay

## 2019-05-15 ENCOUNTER — Encounter: Payer: Self-pay | Admitting: Internal Medicine

## 2019-05-15 ENCOUNTER — Ambulatory Visit: Payer: BC Managed Care – PPO | Admitting: Internal Medicine

## 2019-05-15 VITALS — BP 122/74 | HR 88 | Temp 98.5°F | Ht 62.6 in | Wt 193.0 lb

## 2019-05-15 DIAGNOSIS — E7800 Pure hypercholesterolemia, unspecified: Secondary | ICD-10-CM

## 2019-05-15 DIAGNOSIS — Z0001 Encounter for general adult medical examination with abnormal findings: Secondary | ICD-10-CM

## 2019-05-15 DIAGNOSIS — I10 Essential (primary) hypertension: Secondary | ICD-10-CM | POA: Diagnosis not present

## 2019-05-15 DIAGNOSIS — E041 Nontoxic single thyroid nodule: Secondary | ICD-10-CM

## 2019-05-15 DIAGNOSIS — E66811 Obesity, class 1: Secondary | ICD-10-CM

## 2019-05-15 DIAGNOSIS — L821 Other seborrheic keratosis: Secondary | ICD-10-CM

## 2019-05-15 DIAGNOSIS — L700 Acne vulgaris: Secondary | ICD-10-CM

## 2019-05-15 DIAGNOSIS — Z0000 Encounter for general adult medical examination without abnormal findings: Secondary | ICD-10-CM

## 2019-05-15 DIAGNOSIS — Z6834 Body mass index (BMI) 34.0-34.9, adult: Secondary | ICD-10-CM

## 2019-05-15 DIAGNOSIS — E6609 Other obesity due to excess calories: Secondary | ICD-10-CM

## 2019-05-15 LAB — POCT URINALYSIS DIPSTICK
Bilirubin, UA: NEGATIVE
Glucose, UA: NEGATIVE
Ketones, UA: NEGATIVE
Leukocytes, UA: NEGATIVE
Nitrite, UA: NEGATIVE
Protein, UA: NEGATIVE
Spec Grav, UA: 1.02 (ref 1.010–1.025)
Urobilinogen, UA: 0.2 E.U./dL
pH, UA: 7 (ref 5.0–8.0)

## 2019-05-15 LAB — POCT UA - MICROALBUMIN
Albumin/Creatinine Ratio, Urine, POC: <30
Creatinine, POC: 200 mg/dL
Microalbumin Ur, POC: 30 mg/L

## 2019-05-15 MED ORDER — PRAVASTATIN SODIUM 40 MG PO TABS: 40.0000 mg | ORAL_TABLET | Freq: Every day | ORAL | 2 refills | Status: DC

## 2019-05-15 MED ORDER — TRIAMTERENE-HCTZ 37.5-25 MG PO TABS: 1.0000 | ORAL_TABLET | Freq: Every day | ORAL | 2 refills | Status: DC

## 2019-05-15 NOTE — Patient Instructions (Signed)
Health Maintenance, Female Adopting a healthy lifestyle and getting preventive care are important in promoting health and wellness. Ask your health care provider about:  The right schedule for you to have regular tests and exams.  Things you can do on your own to prevent diseases and keep yourself healthy. What should I know about diet, weight, and exercise? Eat a healthy diet   Eat a diet that includes plenty of vegetables, fruits, low-fat dairy products, and lean protein.  Do not eat a lot of foods that are high in solid fats, added sugars, or sodium. Maintain a healthy weight Body mass index (BMI) is used to identify weight problems. It estimates body fat based on height and weight. Your health care provider can help determine your BMI and help you achieve or maintain a healthy weight. Get regular exercise Get regular exercise. This is one of the most important things you can do for your health. Most adults should:  Exercise for at least 150 minutes each week. The exercise should increase your heart rate and make you sweat (moderate-intensity exercise).  Do strengthening exercises at least twice a week. This is in addition to the moderate-intensity exercise.  Spend less time sitting. Even light physical activity can be beneficial. Watch cholesterol and blood lipids Have your blood tested for lipids and cholesterol at 63 years of age, then have this test every 5 years. Have your cholesterol levels checked more often if:  Your lipid or cholesterol levels are high.  You are older than 63 years of age.  You are at high risk for heart disease. What should I know about cancer screening? Depending on your health history and family history, you may need to have cancer screening at various ages. This may include screening for:  Breast cancer.  Cervical cancer.  Colorectal cancer.  Skin cancer.  Lung cancer. What should I know about heart disease, diabetes, and high blood  pressure? Blood pressure and heart disease  High blood pressure causes heart disease and increases the risk of stroke. This is more likely to develop in people who have high blood pressure readings, are of African descent, or are overweight.  Have your blood pressure checked: ? Every 3-5 years if you are 18-39 years of age. ? Every year if you are 40 years old or older. Diabetes Have regular diabetes screenings. This checks your fasting blood sugar level. Have the screening done:  Once every three years after age 40 if you are at a normal weight and have a low risk for diabetes.  More often and at a younger age if you are overweight or have a high risk for diabetes. What should I know about preventing infection? Hepatitis B If you have a higher risk for hepatitis B, you should be screened for this virus. Talk with your health care provider to find out if you are at risk for hepatitis B infection. Hepatitis C Testing is recommended for:  Everyone born from 1945 through 1965.  Anyone with known risk factors for hepatitis C. Sexually transmitted infections (STIs)  Get screened for STIs, including gonorrhea and chlamydia, if: ? You are sexually active and are younger than 63 years of age. ? You are older than 63 years of age and your health care provider tells you that you are at risk for this type of infection. ? Your sexual activity has changed since you were last screened, and you are at increased risk for chlamydia or gonorrhea. Ask your health care provider if   you are at risk.  Ask your health care provider about whether you are at high risk for HIV. Your health care provider may recommend a prescription medicine to help prevent HIV infection. If you choose to take medicine to prevent HIV, you should first get tested for HIV. You should then be tested every 3 months for as long as you are taking the medicine. Pregnancy  If you are about to stop having your period (premenopausal) and  you may become pregnant, seek counseling before you get pregnant.  Take 400 to 800 micrograms (mcg) of folic acid every day if you become pregnant.  Ask for birth control (contraception) if you want to prevent pregnancy. Osteoporosis and menopause Osteoporosis is a disease in which the bones lose minerals and strength with aging. This can result in bone fractures. If you are 65 years old or older, or if you are at risk for osteoporosis and fractures, ask your health care provider if you should:  Be screened for bone loss.  Take a calcium or vitamin D supplement to lower your risk of fractures.  Be given hormone replacement therapy (HRT) to treat symptoms of menopause. Follow these instructions at home: Lifestyle  Do not use any products that contain nicotine or tobacco, such as cigarettes, e-cigarettes, and chewing tobacco. If you need help quitting, ask your health care provider.  Do not use street drugs.  Do not share needles.  Ask your health care provider for help if you need support or information about quitting drugs. Alcohol use  Do not drink alcohol if: ? Your health care provider tells you not to drink. ? You are pregnant, may be pregnant, or are planning to become pregnant.  If you drink alcohol: ? Limit how much you use to 0-1 drink a day. ? Limit intake if you are breastfeeding.  Be aware of how much alcohol is in your drink. In the [place] one drink equals one 12 oz bottle of beer (355 mL), one 5 oz glass of wine (148 mL), or one 1 oz glass of hard liquor (44 mL). General instructions  Schedule regular health, dental, and eye exams.  Stay current with your vaccines.  Tell your health care provider if: ? You often feel depressed. ? You have ever been abused or do not feel safe at home. Summary  Adopting a healthy lifestyle and getting preventive care are important in promoting health and wellness.  Follow your health care provider's instructions about healthy  diet, exercising, and getting tested or screened for diseases.  Follow your health care provider's instructions on monitoring your cholesterol and blood pressure. This information is not intended to replace advice given to you by your health care provider. Make sure you discuss any questions you have with your health care provider. Document Revised: 02/09/2018 Document Reviewed: 02/09/2018 Elsevier Patient Education  2020 [employer]  

## 2019-05-15 NOTE — Progress Notes (Addendum)
This visit occurred during the SARS-CoV-2 public health emergency.  Safety protocols were in place, including screening questions prior to the visit, additional usage of staff PPE, and extensive cleaning of exam room while observing appropriate contact time as indicated for disinfecting solutions.  Subjective:     Patient ID: Christina Anthony , female    DOB: [birth year] , 64 y.o.   MRN: [id number]   Chief Complaint  Patient presents with  . Annual Exam  . Hypertension    HPI  She is here today for a full physical exam. She is followed by Dr. Garwin Brothers for her GYN exams. She has no specific concerns or complaints at this time.   Hypertension This is a chronic problem. The current episode started more than 1 year ago. The problem has been gradually improving since onset. The problem is controlled. Pertinent negatives include no blurred vision, chest pain, palpitations or shortness of breath. Risk factors for coronary artery disease include post-menopausal state and sedentary lifestyle. Past treatments include diuretics. The current treatment provides moderate improvement. Compliance problems include exercise.      Past Medical History:  Diagnosis Date  . Chronic uveitis of both eyes   . GERD (gastroesophageal reflux disease)    no meds currently  . Glaucoma (increased eye pressure)   . Goiter   . Hypertension      Family History  Problem Relation Age of Onset  . Diabetes Mother   . Hypertension Mother   . Glaucoma Mother   . Hypertension Father   . Cancer Sister      Current Outpatient Medications:  .  brimonidine (ALPHAGAN) 0.2 % ophthalmic solution, , Disp: , Rfl:  .  cetirizine (ZYRTEC) 10 MG tablet, Take 10 mg by mouth daily.  , Disp: , Rfl:  .  Cholecalciferol (VITAMIN D3) 25 MCG (1000 UT) CAPS, Take by mouth., Disp: , Rfl:  .  cycloSPORINE modified (NEORAL) 50 MG capsule, , Disp: , Rfl:  .  dorzolamide-timolol (COSOPT) 22.3-6.8 MG/ML ophthalmic solution, Place 1  drop into both eyes 2 (two) times daily.  , Disp: , Rfl:  .  folic acid (FOLVITE) 1 MG tablet, , Disp: , Rfl:  .  ketorolac (ACULAR) 0.5 % ophthalmic solution, 1 drop., Disp: , Rfl:  .  latanoprost (XALATAN) 0.005 % ophthalmic solution, Place 1 drop into both eyes at bedtime.  , Disp: , Rfl:  .  methotrexate (RHEUMATREX) 2.5 MG tablet, TAKE 8 TABLETS (20 MG TOTAL) BY MOUTH ONCE A WEEK., Disp: , Rfl:  .  Multiple Vitamin (THERA) TABS, Take by mouth., Disp: , Rfl:  .  omeprazole (PRILOSEC) 20 MG capsule, Take by mouth., Disp: , Rfl:  .  pravastatin (PRAVACHOL) 40 MG tablet, TAKE 1 TABLET BY MOUTH EVERY DAY, Disp: 90 tablet, Rfl: 1 .  prednisoLONE acetate (PRED FORTE) 1 % ophthalmic suspension, 1 drop., Disp: , Rfl:  .  triamterene-hydrochlorothiazide (MAXZIDE-25) 37.5-25 MG tablet, TAKE 1 TABLET BY MOUTH EVERY DAY, Disp: 90 tablet, Rfl: 1   No Known Allergies    The patient states she uses post menopausal status for birth control. Last LMP was No LMP recorded. Patient is postmenopausal.. Negative for Dysmenorrhea Negative for: breast discharge, breast lump(s), breast pain and breast self exam. Associated symptoms include abnormal vaginal bleeding. Pertinent negatives include abnormal bleeding (hematology), anxiety, decreased libido, depression, difficulty falling sleep, dyspareunia, history of infertility, nocturia, sexual dysfunction, sleep disturbances, urinary incontinence, urinary urgency, vaginal discharge and vaginal itching. Diet regular.The patient states her  exercise level is  intermittent.   . The patient's tobacco use is:  Social History   Tobacco Use  Smoking Status Current Every Day Smoker  . Packs/day: 0.25  . Years: 45.00  . Pack years: 11.25  . Types: Cigarettes  Smokeless Tobacco Never Used  Tobacco Comment   decrease number of cigs smoked per day  . She has been exposed to passive smoke. The patient's alcohol use is:  Social History   Substance and Sexual Activity   Alcohol Use Yes  . Alcohol/week: 1.0 standard drinks  . Types: 1 Cans of beer per week   Comment: daily    Review of Systems  Constitutional: Negative.   HENT: Negative.   Eyes: Negative.  Negative for blurred vision.  Respiratory: Negative.  Negative for shortness of breath.   Cardiovascular: Negative.  Negative for chest pain and palpitations.  Endocrine: Negative.   Genitourinary: Negative.   Musculoskeletal: Negative.   Skin: Negative.        She c/o itchy skin lesions on neck. Wants to know if they can be removed. Denies rash. She reports they sometimes get caught in her clothing/jewelry.   Allergic/Immunologic: Negative.   Neurological: Negative.   Hematological: Negative.   Psychiatric/Behavioral: Negative.      Today's Vitals   05/15/19 1012  BP: 122/74  Pulse: 88  Temp: 98.5 F (36.9 C)  Weight: 193 lb (87.5 kg)  Height: 5' 2.6" (1.59 m)   Body mass index is 34.63 kg/m.   Objective:  Physical Exam Vitals and nursing note reviewed.  Constitutional:      Appearance: Normal appearance.  HENT:     Head: Normocephalic and atraumatic.     Right Ear: Tympanic membrane, ear canal and external ear normal.     Left Ear: Tympanic membrane, ear canal and external ear normal.     Nose:     Comments: Deferred, masked    Mouth/Throat:     Comments: Deferred, masked Eyes:     Extraocular Movements: Extraocular movements intact.     Conjunctiva/sclera: Conjunctivae normal.     Comments: irriegular pupils b/l  Neck:     Thyroid: Thyroid mass present.  Cardiovascular:     Rate and Rhythm: Normal rate and regular rhythm.     Pulses: Normal pulses.     Heart sounds: Normal heart sounds.  Pulmonary:     Effort: Pulmonary effort is normal.     Breath sounds: Normal breath sounds.  Chest:     Breasts: Tanner Score is 5.        Right: Normal.        Left: Normal.  Abdominal:     General: Abdomen is flat. Bowel sounds are normal.     Palpations: Abdomen is soft.   Genitourinary:    Comments: deferred Musculoskeletal:        General: Normal range of motion.     Cervical back: Normal range of motion and neck supple.  Skin:    General: Skin is warm and dry.     Comments: Hyperpigmented, papular lesions on neck, anterior chest Comedone located r anterior chest. No surrounding erythema  Neurological:     General: No focal deficit present.     Mental Status: She is alert and oriented to person, place, and time.  Psychiatric:        Mood and Affect: Mood normal.        Behavior: Behavior normal.         Assessment  And Plan:   1. Routine general medical examination at health care facility  A full exam was performed. Importance of monthly self breast exams was discussed with the patient. She is up to date with mammo, colonoscopy, pelvic exams. PATIENT IS ADVISED TO GET 30-45 MINUTES REGULAR EXERCISE NO LESS THAN FOUR TO FIVE DAYS PER WEEK - BOTH WEIGHTBEARING EXERCISES AND AEROBIC ARE RECOMMENDED.  SHE IS ADVISED TO FOLLOW A HEALTHY DIET WITH AT LEAST SIX FRUITS/VEGGIES PER DAY, DECREASE INTAKE OF RED MEAT, AND TO INCREASE FISH INTAKE TO TWO DAYS PER WEEK.  MEATS/FISH SHOULD NOT BE FRIED, BAKED OR BROILED IS PREFERABLE.  I SUGGEST WEARING SPF 50 SUNSCREEN ON EXPOSED PARTS AND ESPECIALLY WHEN IN THE DIRECT SUNLIGHT FOR AN EXTENDED PERIOD OF TIME.  PLEASE AVOID FAST FOOD RESTAURANTS AND INCREASE YOUR WATER INTAKE.   - EKG 12-Lead - POCT Urinalysis Dipstick (81002) - POCT UA - Microalbumin  2. Essential hypertension, benign  Chronic, well controlled. She will continue with current meds. She is encouraged to avoid adding salt to her foods. EKG performed, NSR w/o acute changes. She will rto in six months for re-evaluation.   - EKG 12-Lead - POCT Urinalysis Dipstick (81002) - POCT UA - Microalbumin  3. Pure hypercholesterolemia  Chronic, we discussed importance of getting to LDL goal. She is encouraged to avoid fried foods and to increase her  exercise. I will make further recommendations once her labs are available for review.   4. Class 1 obesity due to excess calories with serious comorbidity and body mass index (BMI) of 34.0 to 34.9 in adult  She is encouraged to strive for BMI less than 30 to decrease cardiac risk. Advised to aim for at least 150 minutes of exercise per week.    5. Dermatosis papulosa nigra  I will refer her to Derm for removal.   6. Thyroid nodule  She prefers to contact [facility] on her own to schedule biopsy appointment. I will check thyroid function today.   - TSH  7. Comedone  After obtaining verbal consent, one comedone was extracted using comedone extractor. Purulent material was extracted from the lesion. There were no complications from this procedure.    [name], MD    THE PATIENT IS ENCOURAGED TO PRACTICE SOCIAL DISTANCING DUE TO THE COVID-19 PANDEMIC.

## 2019-05-16 LAB — CBC WITH DIFFERENTIAL/PLATELET
Basophils Absolute: 0.1 10*3/uL (ref 0.0–0.2)
Basos: 1 %
EOS (ABSOLUTE): 0.2 10*3/uL (ref 0.0–0.4)
Eos: 2 %
Hematocrit: 39.9 % (ref 34.0–46.6)
Hemoglobin: 13.2 g/dL (ref 11.1–15.9)
Immature Grans (Abs): 0 10*3/uL (ref 0.0–0.1)
Immature Granulocytes: 0 %
Lymphocytes Absolute: 3.2 10*3/uL — ABNORMAL HIGH (ref 0.7–3.1)
Lymphs: 34 %
MCH: 30.1 pg (ref 26.6–33.0)
MCHC: 33.1 g/dL (ref 31.5–35.7)
MCV: 91 fL (ref 79–97)
Monocytes Absolute: 0.5 10*3/uL (ref 0.1–0.9)
Monocytes: 6 %
Neutrophils Absolute: 5.6 10*3/uL (ref 1.4–7.0)
Neutrophils: 57 %
Platelets: 315 10*3/uL (ref 150–450)
RBC: 4.38 x10E6/uL (ref 3.77–5.28)
RDW: 14.4 % (ref 11.7–15.4)
WBC: 9.7 10*3/uL (ref 3.4–10.8)

## 2019-05-16 LAB — BMP8+EGFR
BUN/Creatinine Ratio: 17 (ref 12–28)
BUN: 14 mg/dL (ref 8–27)
CO2: 27 mmol/L (ref 20–29)
Calcium: 10.5 mg/dL — ABNORMAL HIGH (ref 8.7–10.3)
Chloride: 101 mmol/L (ref 96–106)
Creatinine, Ser: 0.83 mg/dL (ref 0.57–1.00)
GFR calc Af Amer: 87 mL/min/{1.73_m2} (ref >59)
GFR calc non Af Amer: 76 mL/min/{1.73_m2} (ref >59)
Glucose: 88 mg/dL (ref 65–99)
Potassium: 4.1 mmol/L (ref 3.5–5.2)
Sodium: 141 mmol/L (ref 134–144)

## 2019-05-16 LAB — HEMOGLOBIN A1C
Est. average glucose Bld gHb Est-mCnc: 140 mg/dL
Hgb A1c MFr Bld: 6.5 % — ABNORMAL HIGH (ref 4.8–5.6)

## 2019-05-16 LAB — TSH: TSH: 0.59 u[IU]/mL (ref 0.450–4.500)

## 2019-05-17 NOTE — Addendum Note (Signed)
Addended by: [name] on: 05/17/2019 07:17 PM   Modules accepted: Orders, Level of Service

## 2019-05-18 ENCOUNTER — Telehealth: Payer: Self-pay

## 2019-05-18 NOTE — Telephone Encounter (Signed)
Your kidney function is stable. Your calcium level is slightly elevated. Be sure to stay well hydrated. Your thyroid function is within normal limits.   Your hba1c is 6.5, this is up from previous visit. This is in diabetes range. Please cut back on sugary drinks including teas, sodas and juices. Also start walking regularly, at least 30 minutes five days per week. I would like to see you in 3 months ot re-check your a1c. Your blood count is normal.   Please let me know if you have any questions or concerns. Stay safe!

## 2019-05-18 NOTE — Telephone Encounter (Signed)
-----   Message from Glendale Chard, MD sent at 05/16/2019  6:22 PM EDT ----- Making sure you got these results and explanation. thx ----- Message ----- From: Michelle Nasuti, [place] Sent: 05/15/2019  11:33 AM EDT To: Glendale Chard, MD

## 2019-05-30 ENCOUNTER — Ambulatory Visit (INDEPENDENT_AMBULATORY_CARE_PROVIDER_SITE_OTHER): Payer: BC Managed Care – PPO

## 2019-05-30 ENCOUNTER — Other Ambulatory Visit: Payer: Self-pay

## 2019-05-30 VITALS — BP 132/70 | HR 74 | Temp 98.5°F | Ht 63.6 in | Wt 194.4 lb

## 2019-05-30 DIAGNOSIS — R319 Hematuria, unspecified: Secondary | ICD-10-CM | POA: Diagnosis not present

## 2019-05-30 LAB — POCT URINALYSIS DIPSTICK
Glucose, UA: NEGATIVE
Ketones, UA: NEGATIVE
Nitrite, UA: NEGATIVE
Protein, UA: POSITIVE — AB
Spec Grav, UA: >=1.03 — AB (ref 1.010–1.025)
Urobilinogen, UA: 0.2 E.U./dL
pH, UA: 5.5 (ref 5.0–8.0)

## 2019-05-30 NOTE — Progress Notes (Signed)
Pt was here for a urine check due to blood being in urine at last visit. Blood still present in urine. Urine culture sent to [facility]. Referral placed for kidney ultrasound.

## 2019-05-31 LAB — URINE CULTURE: Organism ID, Bacteria: NO GROWTH

## 2019-06-07 ENCOUNTER — Ambulatory Visit
Admission: RE | Admit: 2019-06-07 | Discharge: 2019-06-07 | Disposition: A | Discharge status: Home / Self Care | Payer: BC Managed Care – PPO | Source: Ambulatory Visit | Attending: Internal Medicine | Admitting: Internal Medicine

## 2019-06-07 DIAGNOSIS — R319 Hematuria, unspecified: Secondary | ICD-10-CM

## 2019-06-20 ENCOUNTER — Telehealth: Payer: Self-pay

## 2019-06-20 DIAGNOSIS — R0683 Snoring: Secondary | ICD-10-CM | POA: Insufficient documentation

## 2019-06-20 NOTE — Telephone Encounter (Signed)
Left vm for pt to call back to make an appt for something to help her stop smoking

## 2019-06-22 ENCOUNTER — Ambulatory Visit: Payer: BC Managed Care – PPO | Admitting: Internal Medicine

## 2019-06-22 ENCOUNTER — Other Ambulatory Visit: Payer: Self-pay

## 2019-06-22 ENCOUNTER — Encounter: Payer: Self-pay | Admitting: Internal Medicine

## 2019-06-22 VITALS — BP 120/70 | HR 60 | Temp 98.1°F | Ht 63.6 in | Wt 194.4 lb

## 2019-06-22 DIAGNOSIS — F17210 Nicotine dependence, cigarettes, uncomplicated: Secondary | ICD-10-CM

## 2019-06-22 DIAGNOSIS — I10 Essential (primary) hypertension: Secondary | ICD-10-CM

## 2019-06-22 MED ORDER — CHANTIX STARTING MONTH PAK 0.5 MG X 11 & 1 MG X 42 PO TABS: ORAL_TABLET | ORAL | 0 refills | Status: DC

## 2019-06-22 NOTE — Patient Instructions (Signed)
Coping with Quitting Smoking  Quitting smoking is a physical and mental challenge. You will face cravings, withdrawal symptoms, and temptation. Before quitting, work with your health care provider to make a plan that can help you cope. Preparation can help you quit and keep you from giving in. How can I cope with cravings? Cravings usually last for 5-10 minutes. If you get through it, the craving will pass. Consider taking the following actions to help you cope with cravings:  Keep your mouth busy: ? Chew sugar-free gum. ? Suck on hard candies or a straw. ? Brush your teeth.  Keep your hands and body busy: ? Immediately change to a different activity when you feel a craving. ? Squeeze or play with a ball. ? Do an activity or a hobby, like making bead jewelry, practicing needlepoint, or working with wood. ? Mix up your normal routine. ? Take a short exercise break. Go for a quick walk or run up and down stairs. ? Spend time in public places where smoking is not allowed.  Focus on doing something kind or helpful for someone else.  Call a friend or family member to talk during a craving.  Join a support group.  Call a quit line, such as 1-800-QUIT-NOW.  Talk with your health care provider about medicines that might help you cope with cravings and make quitting easier for you. How can I deal with withdrawal symptoms? Your body may experience negative effects as it tries to get used to not having nicotine in the system. These effects are called withdrawal symptoms. They may include:  Feeling hungrier than normal.  Trouble concentrating.  Irritability.  Trouble sleeping.  Feeling depressed.  Restlessness and agitation.  Craving a cigarette. To manage withdrawal symptoms:  Avoid places, people, and activities that trigger your cravings.  Remember why you want to quit.  Get plenty of sleep.  Avoid coffee and other caffeinated drinks. These may worsen some of your  symptoms. How can I handle social situations? Social situations can be difficult when you are quitting smoking, especially in the first few weeks. To manage this, you can:  Avoid parties, bars, and other social situations where people might be smoking.  Avoid alcohol.  Leave right away if you have the urge to smoke.  Explain to your family and friends that you are quitting smoking. Ask for understanding and support.  Plan activities with friends or family where smoking is not an option. What are some ways I can cope with stress? Wanting to smoke may cause stress, and stress can make you want to smoke. Find ways to manage your stress. Relaxation techniques can help. For example:  Breathe slowly and deeply, in through your nose and out through your mouth.  Listen to soothing, relaxing music.  Talk with a family member or friend about your stress.  Light a candle.  Soak in a bath or take a shower.  Think about a peaceful place. What are some ways I can prevent weight gain? Be aware that many people gain weight after they quit smoking. However, not everyone does. To keep from gaining weight, have a plan in place before you quit and stick to the plan after you quit. Your plan should include:  Having healthy snacks. When you have a craving, it may help to: ? Eat plain popcorn, crunchy carrots, celery, or other cut vegetables. ? Chew sugar-free gum.  Changing how you eat: ? Eat small portion sizes at meals. ? Eat 4-6 small meals   throughout the day instead of 1-2 large meals a day. ? Be mindful when you eat. Do not watch television or do other things that might distract you as you eat.  Exercising regularly: ? Make time to exercise each day. If you do not have time for a long workout, do short bouts of exercise for 5-10 minutes several times a day. ? Do some form of strengthening exercise, like weight lifting, and some form of aerobic exercise, like running or swimming.  Drinking  plenty of water or other low-calorie or no-calorie drinks. Drink 6-8 glasses of water daily, or as much as instructed by your health care provider. Summary  Quitting smoking is a physical and mental challenge. You will face cravings, withdrawal symptoms, and temptation to smoke again. Preparation can help you as you go through these challenges.  You can cope with cravings by keeping your mouth busy (such as by chewing gum), keeping your body and hands busy, and making calls to family, friends, or a helpline for people who want to quit smoking.  You can cope with withdrawal symptoms by avoiding places where people smoke, avoiding drinks with caffeine, and getting plenty of rest.  Ask your health care provider about the different ways to prevent weight gain, avoid stress, and handle social situations. This information is not intended to replace advice given to you by your health care provider. Make sure you discuss any questions you have with your health care provider. Document Revised: 01/29/2017 Document Reviewed: 02/14/2016 Elsevier Patient Education  2020 [employer]  

## 2019-06-24 NOTE — Progress Notes (Signed)
This visit occurred during the SARS-CoV-2 public health emergency.  Safety protocols were in place, including screening questions prior to the visit, additional usage of staff PPE, and extensive cleaning of exam room while observing appropriate contact time as indicated for disinfecting solutions.  Subjective:     Patient ID: Christina Anthony , female    DOB: 03-18-1955 , 64 y.o.   MRN: [id number]   Chief Complaint  Patient presents with  . Nicotine Dependence    HPI  She is here today to discuss smoking cessation. She has smoked for 1/4 ppd for 25 years. She is now ready to quit. Okay with medication assisted therapy.   Nicotine Dependence Presents for initial visit. Preferred tobacco types include cigarettes. Her urge triggers include company of smokers. She smokes < 1/2 a pack of cigarettes per day. She started smoking when she was 17-22 years old. Past treatments include nothing. Kania is thinking about quitting. Annikah has tried to quit 1 time.     Past Medical History:  Diagnosis Date  . Chronic uveitis of both eyes   . GERD (gastroesophageal reflux disease)    no meds currently  . Glaucoma (increased eye pressure)   . Goiter   . Hypertension      Family History  Problem Relation Age of Onset  . Diabetes Mother   . Hypertension Mother   . Glaucoma Mother   . Hypertension Father   . Cancer Sister      Current Outpatient Medications:  .  brimonidine (ALPHAGAN) 0.2 % ophthalmic solution, , Disp: , Rfl:  .  cetirizine (ZYRTEC) 10 MG tablet, Take 10 mg by mouth daily.  , Disp: , Rfl:  .  Cholecalciferol (VITAMIN D3) 25 MCG (1000 UT) CAPS, Take by mouth., Disp: , Rfl:  .  cycloSPORINE modified (NEORAL) 50 MG capsule, , Disp: , Rfl:  .  dorzolamide-timolol (COSOPT) 22.3-6.8 MG/ML ophthalmic solution, Place 1 drop into both eyes 2 (two) times daily.  , Disp: , Rfl:  .  folic acid (FOLVITE) 1 MG tablet, , Disp: , Rfl:  .  ketorolac (ACULAR) 0.5 % ophthalmic solution,  1 drop., Disp: , Rfl:  .  latanoprost (XALATAN) 0.005 % ophthalmic solution, Place 1 drop into both eyes at bedtime.  , Disp: , Rfl:  .  methotrexate (RHEUMATREX) 2.5 MG tablet, TAKE 8 TABLETS (20 MG TOTAL) BY MOUTH ONCE A WEEK., Disp: , Rfl:  .  Multiple Vitamin (THERA) TABS, Take by mouth., Disp: , Rfl:  .  omeprazole (PRILOSEC) 20 MG capsule, Take by mouth., Disp: , Rfl:  .  pravastatin (PRAVACHOL) 40 MG tablet, Take 1 tablet (40 mg total) by mouth daily., Disp: 90 tablet, Rfl: 2 .  prednisoLONE acetate (PRED FORTE) 1 % ophthalmic suspension, 1 drop., Disp: , Rfl:  .  triamterene-hydrochlorothiazide (MAXZIDE-25) 37.5-25 MG tablet, Take 1 tablet by mouth daily., Disp: 90 tablet, Rfl: 2 .  varenicline (CHANTIX STARTING MONTH PAK) 0.5 MG X 11 & 1 MG X 42 tablet, Take one 0.5 mg tablet by mouth once daily for 3 days, then increase to one 0.5 mg tablet twice daily for 4 days, then increase to one 1 mg tablet twice daily., Disp: 53 tablet, Rfl: 0   No Known Allergies   Review of Systems  Constitutional: Negative.   Respiratory: Negative.   Cardiovascular: Negative.   Gastrointestinal: Negative.   Neurological: Negative.   Psychiatric/Behavioral: Negative.      Today's Vitals   06/22/19 1447  BP: 120/70  Pulse: 60  Temp: 98.1 F (36.7 C)  TempSrc: Oral  Weight: 194 lb 6.4 oz (88.2 kg)  Height: 5' 3.6" (1.615 m)   Body mass index is 33.79 kg/m.   Objective:  Physical Exam Vitals and nursing note reviewed.  Constitutional:      Appearance: Normal appearance.  HENT:     Head: Normocephalic and atraumatic.  Cardiovascular:     Rate and Rhythm: Normal rate and regular rhythm.     Heart sounds: Normal heart sounds.  Pulmonary:     Effort: Pulmonary effort is normal.     Breath sounds: Normal breath sounds.  Skin:    General: Skin is warm.  Neurological:     General: No focal deficit present.     Mental Status: She is alert.  Psychiatric:        Mood and Affect: Mood normal.         Behavior: Behavior normal.         Assessment And Plan:     1. Cigarette nicotine dependence without complication  Chronic. We discussed the use of Chantix to help address nicotine dependence. Possible side effects were discussed with the patient. Advised to take with meals at breakfast and dinner. She will rto in 4 weeks for re-evaluation. She is encouraged to contact me sooner if needed.   2. Essential hypertension, benign  Chronic, well controlled. She will continue with current meds.   [name], MD    THE PATIENT IS ENCOURAGED TO PRACTICE SOCIAL DISTANCING DUE TO THE COVID-19 PANDEMIC.

## 2019-07-06 ENCOUNTER — Other Ambulatory Visit: Payer: Self-pay

## 2019-07-06 MED ORDER — PRAVASTATIN SODIUM 40 MG PO TABS: 40.0000 mg | ORAL_TABLET | Freq: Every day | ORAL | 1 refills | Status: DC

## 2019-07-11 ENCOUNTER — Ambulatory Visit: Payer: BC Managed Care – PPO | Admitting: Physician Assistant

## 2019-07-11 ENCOUNTER — Other Ambulatory Visit: Payer: Self-pay

## 2019-07-11 ENCOUNTER — Encounter: Payer: Self-pay | Admitting: Physician Assistant

## 2019-07-11 DIAGNOSIS — L821 Other seborrheic keratosis: Secondary | ICD-10-CM | POA: Diagnosis not present

## 2019-07-11 DIAGNOSIS — L820 Inflamed seborrheic keratosis: Secondary | ICD-10-CM

## 2019-07-11 DIAGNOSIS — Z1283 Encounter for screening for malignant neoplasm of skin: Secondary | ICD-10-CM

## 2019-07-11 NOTE — Patient Instructions (Signed)

## 2019-08-24 ENCOUNTER — Encounter: Payer: Self-pay | Admitting: Internal Medicine

## 2019-08-24 ENCOUNTER — Other Ambulatory Visit: Payer: Self-pay

## 2019-08-24 ENCOUNTER — Ambulatory Visit: Payer: BC Managed Care – PPO | Admitting: Internal Medicine

## 2019-08-24 VITALS — BP 114/72 | HR 63 | Temp 98.1°F | Ht 63.6 in | Wt 190.6 lb

## 2019-08-24 DIAGNOSIS — R7309 Other abnormal glucose: Secondary | ICD-10-CM

## 2019-08-24 DIAGNOSIS — F518 Other sleep disorders not due to a substance or known physiological condition: Secondary | ICD-10-CM | POA: Diagnosis not present

## 2019-08-24 DIAGNOSIS — F17210 Nicotine dependence, cigarettes, uncomplicated: Secondary | ICD-10-CM | POA: Diagnosis not present

## 2019-08-24 DIAGNOSIS — E041 Nontoxic single thyroid nodule: Secondary | ICD-10-CM

## 2019-08-24 DIAGNOSIS — E66811 Other obesity due to excess calories: Secondary | ICD-10-CM

## 2019-08-24 DIAGNOSIS — E6609 Other obesity due to excess calories: Secondary | ICD-10-CM

## 2019-08-24 DIAGNOSIS — Z6833 Body mass index (BMI) 33.0-33.9, adult: Secondary | ICD-10-CM

## 2019-08-24 MED ORDER — VARENICLINE TARTRATE 1 MG PO TABS: 1.0000 mg | ORAL_TABLET | Freq: Two times a day (BID) | ORAL | 3 refills | Status: DC

## 2019-08-24 NOTE — Progress Notes (Signed)
This visit occurred during the SARS-CoV-2 public health emergency.  Safety protocols were in place, including screening questions prior to the visit, additional usage of staff PPE, and extensive cleaning of exam room while observing appropriate contact time as indicated for disinfecting solutions.  Subjective:     Patient ID: Christina Anthony , female    DOB: 10-28-1955 , 64 y.o.   MRN: [id number]   Chief Complaint  Patient presents with  . Nicotine Dependence    HPI  She was started on Chantix at her last visit. She has cut back to 1 cig/day. She feels the medication has been effective to decrease her cravings for cigarettes.  She does report having strange dreams. She states she has been having dreams about people who have passed on.     Past Medical History:  Diagnosis Date  . Chronic uveitis of both eyes   . GERD (gastroesophageal reflux disease)    no meds currently  . Glaucoma (increased eye pressure)   . Goiter   . Hypertension      Family History  Problem Relation Age of Onset  . Diabetes Mother   . Hypertension Mother   . Glaucoma Mother   . Hypertension Father   . Cancer Sister      Current Outpatient Medications:  .  brimonidine (ALPHAGAN) 0.2 % ophthalmic solution, , Disp: , Rfl:  .  cetirizine (ZYRTEC) 10 MG tablet, Take 10 mg by mouth daily.  , Disp: , Rfl:  .  Cholecalciferol (VITAMIN D3) 25 MCG (1000 UT) CAPS, Take by mouth., Disp: , Rfl:  .  cycloSPORINE modified (NEORAL) 50 MG capsule, 2 tabs 2 times per day, Disp: , Rfl:  .  dorzolamide-timolol (COSOPT) 22.3-6.8 MG/ML ophthalmic solution, Place 1 drop into both eyes 2 (two) times daily.  , Disp: , Rfl:  .  esomeprazole (NEXIUM) 20 MG capsule, Take by mouth. prn, Disp: , Rfl:  .  folic acid (FOLVITE) 1 MG tablet, , Disp: , Rfl:  .  ibuprofen (ADVIL) 800 MG tablet, Take by mouth., Disp: , Rfl:  .  methotrexate (RHEUMATREX) 2.5 MG tablet, TAKE 8 TABLETS (20 MG TOTAL) BY MOUTH ONCE A WEEK., Disp: ,  Rfl:  .  Multiple Vitamins-Minerals (CENTRUM SILVER 50+WOMEN) TABS, Take by mouth., Disp: , Rfl:  .  omeprazole (PRILOSEC) 20 MG capsule, Take by mouth., Disp: , Rfl:  .  pilocarpine (PILOCAR) 1 % ophthalmic solution, Place 1 drop into the right eye 3 times daily., Disp: , Rfl:  .  triamterene-hydrochlorothiazide (MAXZIDE-25) 37.5-25 MG tablet, Take 1 tablet by mouth daily., Disp: 90 tablet, Rfl: 2 .  Multiple Vitamin (THERA) TABS, Take by mouth., Disp: , Rfl:  .  pravastatin (PRAVACHOL) 40 MG tablet, Take 1 tablet (40 mg total) by mouth daily., Disp: 90 tablet, Rfl: 1 .  varenicline (CHANTIX CONTINUING MONTH PAK) 1 MG tablet, Take 1 tablet (1 mg total) by mouth 2 (two) times daily., Disp: 60 tablet, Rfl: 3   No Known Allergies   Review of Systems  Constitutional: Negative.   Respiratory: Negative.   Cardiovascular: Negative.   Gastrointestinal: Negative.   Neurological: Negative.   Psychiatric/Behavioral: Negative.      Today's Vitals   08/24/19 1148  BP: 114/72  Pulse: 63  Temp: 98.1 F (36.7 C)  TempSrc: Oral  Weight: 190 lb 9.6 oz (86.5 kg)  Height: 5' 3.6" (1.615 m)  PainSc: 0-No pain   Body mass index is 33.13 kg/m.   Objective:  Physical  Exam Vitals and nursing note reviewed.  Constitutional:      Appearance: Normal appearance.  HENT:     Head: Normocephalic and atraumatic.  Cardiovascular:     Rate and Rhythm: Normal rate and regular rhythm.     Heart sounds: Normal heart sounds.  Pulmonary:     Effort: Pulmonary effort is normal.     Breath sounds: Normal breath sounds.  Skin:    General: Skin is warm.  Neurological:     General: No focal deficit present.     Mental Status: She is alert.  Psychiatric:        Mood and Affect: Mood normal.        Behavior: Behavior normal.         Assessment And Plan:     1. Cigarette nicotine dependence without complication  She was congratulated on her progress thus far. Despite bad dreams, she wants to continue  with medication. Will decrease dose to 1mg  ONCE daily to see if her side effects decrease. She will contact me in a week or two to let me know how she is doing.   2. Abnormal dreams  Known side effect of Chantix. She denies change in her mood.  Since this does not occur nightly, she is encouraged to see if she can identify a pattern in her symptoms.  How late did she eat dinner, Does time vary in when she takes pm dose, etc.   3. Other abnormal glucose  HER A1C HAS BEEN ELEVATED IN THE PAST. I WILL CHECK AN A1C, BMET TODAY. SHE WAS ENCOURAGED TO AVOID SUGARY BEVERAGES AND PROCESSED FOODS INCLUDNG BREADS, RICE AND PASTA.  - Hemoglobin A1c  4. Thyroid nodule  She has yet to schedule biopsy. She is encouraged to schedule within the next week or two.   5. Class 1 obesity due to excess calories with serious comorbidity and body mass index (BMI) of 33.0 to 33.9 in adult  She is encouraged to strive for BMI less than 30 to decrease cardiac risk. She is encouraged to incorporate more exercise into her daily routine.     Christina Greenland, MD    THE PATIENT IS ENCOURAGED TO PRACTICE SOCIAL DISTANCING DUE TO THE COVID-19 PANDEMIC.

## 2019-08-25 LAB — HEMOGLOBIN A1C
Est. average glucose Bld gHb Est-mCnc: 131 mg/dL
Hgb A1c MFr Bld: 6.2 % — ABNORMAL HIGH (ref 4.8–5.6)

## 2019-08-26 ENCOUNTER — Encounter: Payer: Self-pay | Admitting: Internal Medicine

## 2019-09-01 ENCOUNTER — Telehealth: Payer: Self-pay

## 2019-09-01 NOTE — Telephone Encounter (Signed)
-----   Message from Glendale Chard, MD sent at 08/25/2019  6:25 PM EDT ----- Your hba1c is 6.2, this is in prediabetes range - this has improved slightly. It is important to avoid sugary beverages. You may add splash of cranberry juice to water or drink unsweetened tea when you get tired of plain water. You may also add fruit to water - lemons, oranges, limes,  and mint etc.

## 2019-09-01 NOTE — Telephone Encounter (Signed)
lvm for pt to return call for lab results  

## 2019-09-21 ENCOUNTER — Other Ambulatory Visit: Payer: Self-pay | Admitting: Internal Medicine

## 2019-10-03 ENCOUNTER — Encounter: Payer: BC Managed Care – PPO | Admitting: Physician Assistant

## 2019-10-04 ENCOUNTER — Encounter: Payer: BC Managed Care – PPO | Admitting: Physician Assistant

## 2019-10-30 NOTE — Addendum Note (Signed)
Addended by: Robyne Askew R on: 10/30/2019 04:29 PM   Modules accepted: Level of Service

## 2019-10-30 NOTE — Progress Notes (Signed)
   New Patient   Subjective  Christina Anthony is a 64 y.o. 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   The following portions of the chart were reviewed this encounter and updated as appropriate: Tobacco  Allergies  Meds  Problems  Med Hx  Surg Hx  Fam Hx      Objective  Well appearing patient in no apparent distress; mood and affect are within normal limits.  A full examination was performed including scalp, head, eyes, ears, nose, lips, neck, chest, axillae, abdomen, back, buttocks, bilateral upper extremities, bilateral lower extremities, hands, feet, fingers, toes, fingernails, and toenails. All findings within normal limits unless otherwise noted below.  Objective  Left 2nd Metatarsal Plantar Area, Right Hand - Anterior: Numerous  dark macules. All stable per patient.  Objective  Anterior Mid Neck, Left Anterior Neck (2), Left Malar Cheek, Right Anterior Neck (2), Right Buccal Cheek : Small brown crusts  Objective  Mid Frontal Scalp, Right Temple (3): Erythematous stuck-on, waxy papule or plaque.    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; Right Buccal Cheek ; Left Anterior Neck (2); Right Anterior Neck (2); Anterior Mid Neck  Schedule 30 min surgery for EC  Inflamed seborrheic keratosis (4) Mid Frontal Scalp; Right Temple (3)  Destruction of lesion - Mid Frontal Scalp, Right Temple Complexity: simple   Destruction method: cryotherapy   Informed consent: discussed and consent obtained   Timeout:  patient name, date of birth, surgical site, and procedure verified Lesion destroyed using liquid nitrogen: Yes   Cryotherapy cycles:  1 Outcome: patient tolerated procedure well with no complications   Post-procedure details: wound care instructions given        I,Jillana Selph,acting as a scribe for Traves Majchrzak, PA-C.,have documented all relevant documentation on the behalf of Adynn Caseres, PA-C,as directed by  [facility], PA-C while in the presence of Endrit Gittins, PA-C.

## 2019-11-05 ENCOUNTER — Other Ambulatory Visit: Payer: Self-pay | Admitting: Internal Medicine

## 2019-11-15 ENCOUNTER — Encounter: Payer: Self-pay | Admitting: Internal Medicine

## 2019-11-15 ENCOUNTER — Ambulatory Visit: Payer: BC Managed Care – PPO | Admitting: Internal Medicine

## 2019-11-15 ENCOUNTER — Other Ambulatory Visit: Payer: Self-pay

## 2019-11-15 VITALS — BP 116/76 | HR 73 | Temp 98.4°F | Ht 63.6 in | Wt 190.2 lb

## 2019-11-15 DIAGNOSIS — R7309 Other abnormal glucose: Secondary | ICD-10-CM

## 2019-11-15 DIAGNOSIS — E7800 Pure hypercholesterolemia, unspecified: Secondary | ICD-10-CM | POA: Diagnosis not present

## 2019-11-15 DIAGNOSIS — I10 Essential (primary) hypertension: Secondary | ICD-10-CM

## 2019-11-15 DIAGNOSIS — Z6833 Body mass index (BMI) 33.0-33.9, adult: Secondary | ICD-10-CM

## 2019-11-15 DIAGNOSIS — F17210 Nicotine dependence, cigarettes, uncomplicated: Secondary | ICD-10-CM

## 2019-11-15 DIAGNOSIS — E6609 Other obesity due to excess calories: Secondary | ICD-10-CM

## 2019-11-15 MED ORDER — CHANTIX STARTING MONTH PAK 0.5 MG X 11 & 1 MG X 42 PO TABS: ORAL_TABLET | ORAL | 0 refills | Status: DC

## 2019-11-15 MED ORDER — TRIAMTERENE-HCTZ 37.5-25 MG PO TABS: 1.0000 | ORAL_TABLET | Freq: Every day | ORAL | 2 refills | Status: DC

## 2019-11-15 NOTE — Patient Instructions (Signed)

## 2019-11-15 NOTE — Progress Notes (Signed)
I,Katawbba Wiggins,acting as a Education administrator for [name], MD.,have documented all relevant documentation on the behalf of [name], MD,as directed by  [name], MD while in the presence of [name], MD.  This visit occurred during the SARS-CoV-2 public health emergency.  Safety protocols were in place, including screening questions prior to the visit, additional usage of staff PPE, and extensive cleaning of exam room while observing appropriate contact time as indicated for disinfecting solutions.  Subjective:     Patient ID: Christina Anthony , female    DOB: Jul 02, 1955 , 64 y.o.   MRN: [id number]   Chief Complaint  Patient presents with  . Hypertension    HPI  The patient is here today for a follow-up on her blood pressure and cholesterol.  She reports compliance with meds. Denies headaches, chest pain and shortness of breath.   Hypertension This is a chronic problem. The current episode started more than 1 year ago. The problem has been gradually improving since onset. The problem is controlled. Pertinent negatives include no blurred vision, chest pain, palpitations or shortness of breath. Risk factors for coronary artery disease include post-menopausal state and sedentary lifestyle. Past treatments include diuretics. The current treatment provides moderate improvement. Compliance problems include exercise.      Past Medical History:  Diagnosis Date  . Chronic uveitis of both eyes   . GERD (gastroesophageal reflux disease)    no meds currently  . Glaucoma (increased eye pressure)   . Goiter   . Hypertension      Family History  Problem Relation Age of Onset  . Diabetes Mother   . Hypertension Mother   . Glaucoma Mother   . Hypertension Father   . Cancer Sister      Current Outpatient Medications:  .  brimonidine (ALPHAGAN) 0.2 % ophthalmic solution, , Disp: , Rfl:  .  cetirizine (ZYRTEC) 10 MG tablet, Take 10 mg by mouth daily.  , Disp: , Rfl:   .  Cholecalciferol (VITAMIN D3) 25 MCG (1000 UT) CAPS, Take by mouth., Disp: , Rfl:  .  cycloSPORINE modified (NEORAL) 50 MG capsule, 2 tabs 2 times per day, Disp: , Rfl:  .  dorzolamide-timolol (COSOPT) 22.3-6.8 MG/ML ophthalmic solution, Place 1 drop into both eyes 2 (two) times daily.  , Disp: , Rfl:  .  Folic Acid 5 MG CAPS, daily. , Disp: , Rfl:  .  latanoprost (XALATAN) 0.005 % ophthalmic solution, , Disp: , Rfl:  .  methotrexate (RHEUMATREX) 2.5 MG tablet, TAKE 8 TABLETS (20 MG TOTAL) BY MOUTH ONCE A WEEK., Disp: , Rfl:  .  Multiple Vitamin (THERA) TABS, Take by mouth., Disp: , Rfl:  .  Multiple Vitamins-Minerals (CENTRUM SILVER 50+WOMEN) TABS, Take by mouth., Disp: , Rfl:  .  Omega-3 Fatty Acids (FISH OIL PO), Take by mouth., Disp: , Rfl:  .  pilocarpine (PILOCAR) 1 % ophthalmic solution, Place 1 drop into the right eye 3 times daily., Disp: , Rfl:  .  pravastatin (PRAVACHOL) 40 MG tablet, Take 1 tablet (40 mg total) by mouth daily. (Patient taking differently: Take 40 mg by mouth daily. Take 2 tabs daily), Disp: 90 tablet, Rfl: 1 .  triamterene-hydrochlorothiazide (MAXZIDE-25) 37.5-25 MG tablet, Take 1 tablet by mouth daily., Disp: 90 tablet, Rfl: 2 .  TURMERIC PO, Take 1 capsule by mouth daily., Disp: , Rfl:  .  esomeprazole (NEXIUM) 20 MG capsule, Take by mouth. prn (Patient not taking: Reported on 11/15/2019), Disp: ,  Rfl:  .  ibuprofen (ADVIL) 800 MG tablet, Take by mouth. (Patient not taking: Reported on 11/15/2019), Disp: , Rfl:  .  omeprazole (PRILOSEC) 20 MG capsule, Take by mouth. (Patient not taking: Reported on 11/15/2019), Disp: , Rfl:    No Known Allergies   Review of Systems  Constitutional: Negative.   Eyes: Negative for blurred vision.  Respiratory: Negative.  Negative for shortness of breath.   Cardiovascular: Negative.  Negative for chest pain and palpitations.  Gastrointestinal: Negative.   Psychiatric/Behavioral: Negative.   All other systems reviewed and are  negative.    Today's Vitals   11/15/19 1137  BP: 116/76  Pulse: 73  Temp: 98.4 F (36.9 C)  TempSrc: Oral  Weight: 190 lb 3.2 oz (86.3 kg)  Height: 5' 3.6" (1.615 m)   Body mass index is 33.06 kg/m.  Wt Readings from Last 3 Encounters:  11/15/19 190 lb 3.2 oz (86.3 kg)  08/24/19 190 lb 9.6 oz (86.5 kg)  06/22/19 194 lb 6.4 oz (88.2 kg)   Objective:  Physical Exam Vitals and nursing note reviewed.  Constitutional:      Appearance: Normal appearance. She is obese.  HENT:     Head: Normocephalic and atraumatic.  Cardiovascular:     Rate and Rhythm: Normal rate and regular rhythm.     Heart sounds: Normal heart sounds.  Pulmonary:     Breath sounds: Normal breath sounds.  Skin:    General: Skin is warm.  Neurological:     General: No focal deficit present.     Mental Status: She is alert and oriented to person, place, and time.         Assessment And Plan:     1. Essential hypertension, benign Comments: Chronic, well controlled. She will continue with current meds. She is encouraged to continue following a low sodium diet.  - CMP14+EGFR  2. Pure hypercholesterolemia Comments: Chronic, I will check lipid panel today. Encouraged to avoid fried foods, increase daily activity and increase her fiber intake.  - Lipid panel - CMP14+EGFR  3. Other abnormal glucose Comments: Her a1c has been elevated in the past, will check repeat study today. Encouraged to avoid sugary beverages.  - Hemoglobin A1c  4. Cigarette nicotine dependence without complication Comments: She did well with Chantix until taken off of the market. Will consider nicotine patches.   5. Class 1 obesity due to excess calories with serious comorbidity and body mass index (BMI) of 33.0 to 33.9 in adult  She is encouraged to strive for BMI less than 30 to decrease cardiac risk. Advised to aim for at least 150 minutes of exercise per week.   Patient was given opportunity to ask questions. Patient  verbalized understanding of the plan and was able to repeat key elements of the plan. All questions were answered to their satisfaction.  [name], MD   I, [name], MD, have reviewed all documentation for this visit. The documentation on 11/26/19 for the exam, diagnosis, procedures, and orders are all accurate and complete.  THE PATIENT IS ENCOURAGED TO PRACTICE SOCIAL DISTANCING DUE TO THE COVID-19 PANDEMIC.

## 2019-11-16 LAB — CMP14+EGFR
ALT: 17 IU/L (ref 0–32)
AST: 16 IU/L (ref 0–40)
Albumin/Globulin Ratio: 1.8 (ref 1.2–2.2)
Albumin: 4.5 g/dL (ref 3.8–4.8)
Alkaline Phosphatase: 84 IU/L (ref 44–121)
BUN/Creatinine Ratio: 12 (ref 12–28)
BUN: 12 mg/dL (ref 8–27)
Bilirubin Total: 0.3 mg/dL (ref 0.0–1.2)
CO2: 27 mmol/L (ref 20–29)
Calcium: 10.7 mg/dL — ABNORMAL HIGH (ref 8.7–10.3)
Chloride: 102 mmol/L (ref 96–106)
Creatinine, Ser: 1.01 mg/dL — ABNORMAL HIGH (ref 0.57–1.00)
GFR calc Af Amer: 69 mL/min/{1.73_m2} (ref >59)
GFR calc non Af Amer: 60 mL/min/{1.73_m2} (ref >59)
Globulin, Total: 2.5 g/dL (ref 1.5–4.5)
Glucose: 104 mg/dL — ABNORMAL HIGH (ref 65–99)
Potassium: 4.2 mmol/L (ref 3.5–5.2)
Sodium: 141 mmol/L (ref 134–144)
Total Protein: 7 g/dL (ref 6.0–8.5)

## 2019-11-16 LAB — LIPID PANEL
Chol/HDL Ratio: 4.6 ratio — ABNORMAL HIGH (ref 0.0–4.4)
Cholesterol, Total: 189 mg/dL (ref 100–199)
HDL: 41 mg/dL (ref >39)
LDL Chol Calc (NIH): 125 mg/dL — ABNORMAL HIGH (ref 0–99)
Triglycerides: 126 mg/dL (ref 0–149)
VLDL Cholesterol Cal: 23 mg/dL (ref 5–40)

## 2019-11-16 LAB — HEMOGLOBIN A1C
Est. average glucose Bld gHb Est-mCnc: 134 mg/dL
Hgb A1c MFr Bld: 6.3 % — ABNORMAL HIGH (ref 4.8–5.6)

## 2019-11-17 ENCOUNTER — Other Ambulatory Visit: Payer: Self-pay

## 2019-11-21 ENCOUNTER — Other Ambulatory Visit: Payer: Self-pay | Admitting: Internal Medicine

## 2019-11-21 DIAGNOSIS — E041 Nontoxic single thyroid nodule: Secondary | ICD-10-CM

## 2019-11-30 ENCOUNTER — Other Ambulatory Visit: Payer: Self-pay | Admitting: Internal Medicine

## 2019-11-30 DIAGNOSIS — Z1231 Encounter for screening mammogram for malignant neoplasm of breast: Secondary | ICD-10-CM

## 2019-12-04 ENCOUNTER — Ambulatory Visit
Admission: RE | Admit: 2019-12-04 | Discharge: 2019-12-04 | Disposition: A | Discharge status: Home / Self Care | Payer: BC Managed Care – PPO | Source: Ambulatory Visit | Attending: Internal Medicine | Admitting: Internal Medicine

## 2019-12-04 ENCOUNTER — Other Ambulatory Visit: Payer: Self-pay

## 2019-12-04 DIAGNOSIS — Z1231 Encounter for screening mammogram for malignant neoplasm of breast: Secondary | ICD-10-CM

## 2019-12-12 ENCOUNTER — Other Ambulatory Visit: Payer: Self-pay | Admitting: Internal Medicine

## 2019-12-12 DIAGNOSIS — E041 Nontoxic single thyroid nodule: Secondary | ICD-10-CM

## 2019-12-19 ENCOUNTER — Other Ambulatory Visit: Payer: Self-pay

## 2019-12-19 ENCOUNTER — Other Ambulatory Visit: Payer: Self-pay | Admitting: Internal Medicine

## 2019-12-19 ENCOUNTER — Ambulatory Visit (INDEPENDENT_AMBULATORY_CARE_PROVIDER_SITE_OTHER): Payer: BC Managed Care – PPO

## 2019-12-19 VITALS — BP 118/72 | HR 76 | Temp 98.2°F | Ht 63.6 in | Wt 190.0 lb

## 2019-12-19 DIAGNOSIS — Z23 Encounter for immunization: Secondary | ICD-10-CM

## 2019-12-19 MED ORDER — PRAVASTATIN SODIUM 40 MG PO TABS: 40.0000 mg | ORAL_TABLET | Freq: Two times a day (BID) | ORAL | 1 refills | Status: DC

## 2019-12-19 NOTE — Progress Notes (Signed)
Pt here for a flu shot

## 2019-12-20 ENCOUNTER — Ambulatory Visit
Admission: RE | Admit: 2019-12-20 | Discharge: 2019-12-20 | Disposition: A | Discharge status: Home / Self Care | Payer: BC Managed Care – PPO | Source: Ambulatory Visit | Attending: Internal Medicine | Admitting: Internal Medicine

## 2019-12-20 DIAGNOSIS — E041 Nontoxic single thyroid nodule: Secondary | ICD-10-CM

## 2019-12-26 ENCOUNTER — Other Ambulatory Visit: Payer: Self-pay | Admitting: Internal Medicine

## 2019-12-26 DIAGNOSIS — E041 Nontoxic single thyroid nodule: Secondary | ICD-10-CM

## 2020-01-04 ENCOUNTER — Ambulatory Visit
Admission: RE | Admit: 2020-01-04 | Discharge: 2020-01-04 | Disposition: A | Discharge status: Home / Self Care | Payer: BC Managed Care – PPO | Source: Ambulatory Visit | Attending: Internal Medicine | Admitting: Internal Medicine

## 2020-01-04 ENCOUNTER — Other Ambulatory Visit (HOSPITAL_COMMUNITY)
Admission: RE | Admit: 2020-01-04 | Discharge: 2020-01-04 | Disposition: A | Discharge status: Home / Self Care | Payer: BC Managed Care – PPO | Source: Ambulatory Visit | Attending: Radiology | Admitting: Radiology

## 2020-01-04 DIAGNOSIS — D34 Benign neoplasm of thyroid gland: Secondary | ICD-10-CM | POA: Diagnosis not present

## 2020-01-04 DIAGNOSIS — E041 Nontoxic single thyroid nodule: Secondary | ICD-10-CM | POA: Diagnosis present

## 2020-01-06 LAB — CYTOLOGY - NON PAP

## 2020-02-16 ENCOUNTER — Other Ambulatory Visit: Payer: Self-pay | Admitting: Internal Medicine

## 2020-04-30 ENCOUNTER — Other Ambulatory Visit: Payer: Self-pay

## 2020-04-30 ENCOUNTER — Other Ambulatory Visit: Payer: Self-pay | Admitting: Internal Medicine

## 2020-04-30 MED ORDER — PRAVASTATIN SODIUM 80 MG PO TABS
80.0000 mg | ORAL_TABLET | Freq: Every day | ORAL | 1 refills | Status: DC
Start: 2020-04-30 — End: 2020-12-06

## 2020-05-16 ENCOUNTER — Encounter: Payer: Self-pay | Admitting: Internal Medicine

## 2020-05-16 ENCOUNTER — Ambulatory Visit: Payer: BC Managed Care – PPO | Admitting: Internal Medicine

## 2020-05-16 ENCOUNTER — Other Ambulatory Visit: Payer: Self-pay

## 2020-05-16 VITALS — BP 116/84 | HR 69 | Temp 98.0°F | Ht 63.4 in | Wt 191.8 lb

## 2020-05-16 DIAGNOSIS — E6609 Other obesity due to excess calories: Secondary | ICD-10-CM

## 2020-05-16 DIAGNOSIS — I10 Essential (primary) hypertension: Secondary | ICD-10-CM | POA: Diagnosis not present

## 2020-05-16 DIAGNOSIS — Z0000 Encounter for general adult medical examination without abnormal findings: Secondary | ICD-10-CM | POA: Diagnosis not present

## 2020-05-16 DIAGNOSIS — E041 Nontoxic single thyroid nodule: Secondary | ICD-10-CM

## 2020-05-16 DIAGNOSIS — E7800 Pure hypercholesterolemia, unspecified: Secondary | ICD-10-CM | POA: Diagnosis not present

## 2020-05-16 DIAGNOSIS — M791 Myalgia, unspecified site: Secondary | ICD-10-CM

## 2020-05-16 DIAGNOSIS — Z6833 Body mass index (BMI) 33.0-33.9, adult: Secondary | ICD-10-CM

## 2020-05-16 LAB — POCT URINALYSIS DIPSTICK
Bilirubin, UA: NEGATIVE
Glucose, UA: NEGATIVE
Ketones, UA: NEGATIVE
Leukocytes, UA: NEGATIVE
Nitrite, UA: NEGATIVE
Protein, UA: NEGATIVE
Spec Grav, UA: 1.015 (ref 1.010–1.025)
Urobilinogen, UA: 0.2 E.U./dL
pH, UA: 7 (ref 5.0–8.0)

## 2020-05-16 LAB — POCT UA - MICROALBUMIN
Albumin/Creatinine Ratio, Urine, POC: 30
Creatinine, POC: 50 mg/dL
Microalbumin Ur, POC: 10 mg/L

## 2020-05-16 NOTE — Progress Notes (Signed)
I,Katawbba Wiggins,acting as a Education administrator for [name], MD.,have documented all relevant documentation on the behalf of [name], MD,as directed by  [name], MD while in the presence of [name], MD.  This visit occurred during the SARS-CoV-2 public health emergency.  Safety protocols were in place, including screening questions prior to the visit, additional usage of staff PPE, and extensive cleaning of exam room while observing appropriate contact time as indicated for disinfecting solutions.  Subjective:     Patient ID: Christina Anthony , female    DOB: Aug 30, 1955 , 65 y.o.   MRN: [id number]   Chief Complaint  Patient presents with  . Annual Exam  . Hypertension    HPI  She is here today for a full physical exam. She is followed by Dr. Garwin Brothers for her GYN exams. She has no specific concerns or complaints at this time.  The patient states she stopped her pravastatin that she was taking 4 days a week because she developed left thigh pain. She reports she is trying to eat better and has started going to water aerobics.   Hypertension This is a chronic problem. The current episode started more than 1 year ago. The problem has been gradually improving since onset. The problem is controlled. Pertinent negatives include no blurred vision, chest pain, palpitations or shortness of breath. Risk factors for coronary artery disease include post-menopausal state and sedentary lifestyle. Past treatments include diuretics. The current treatment provides moderate improvement. Compliance problems include exercise.      Past Medical History:  Diagnosis Date  . Chronic uveitis of both eyes   . GERD (gastroesophageal reflux disease)    no meds currently  . Glaucoma (increased eye pressure)   . Goiter   . Hypertension      Family History  Problem Relation Age of Onset  . Diabetes Mother   . Hypertension Mother   . Glaucoma Mother   . Hypertension Father   . Cancer  Sister      Current Outpatient Medications:  .  brimonidine (ALPHAGAN) 0.2 % ophthalmic solution, 1 drop 3 (three) times daily. 1 drop tid right eye, bid left eye, Disp: , Rfl:  .  cetirizine (ZYRTEC) 10 MG tablet, Take 10 mg by mouth daily., Disp: , Rfl:  .  Cholecalciferol (VITAMIN D3) 25 MCG (1000 UT) CAPS, Take by mouth., Disp: , Rfl:  .  dorzolamide-timolol (COSOPT) 22.3-6.8 MG/ML ophthalmic solution, Place 1 drop into both eyes 2 (two) times daily., Disp: , Rfl:  .  Folic Acid 5 MG CAPS, daily. , Disp: , Rfl:  .  ibuprofen (ADVIL) 800 MG tablet, Take by mouth., Disp: , Rfl:  .  latanoprost (XALATAN) 0.005 % ophthalmic solution, Place 1 drop into the right eye at bedtime., Disp: , Rfl:  .  methotrexate (RHEUMATREX) 2.5 MG tablet, 2.5 mg. 8 tabs weekly, Disp: , Rfl:  .  Multiple Vitamins-Minerals (CENTRUM SILVER 50+WOMEN) TABS, Take by mouth., Disp: , Rfl:  .  Omega-3 Fatty Acids (FISH OIL PO), Take by mouth., Disp: , Rfl:  .  omeprazole (PRILOSEC) 20 MG capsule, Take by mouth., Disp: , Rfl:  .  triamterene-hydrochlorothiazide (MAXZIDE-25) 37.5-25 MG tablet, Take 1 tablet by mouth daily., Disp: 90 tablet, Rfl: 2 .  TURMERIC PO, Take 1 capsule by mouth daily., Disp: , Rfl:  .  Multiple Vitamin (THERA) TABS, Take by mouth., Disp: , Rfl:  .  pravastatin (PRAVACHOL) 80 MG tablet, Take 1 tablet (80 mg  total) by mouth daily. (Patient not taking: No sig reported), Disp: 90 tablet, Rfl: 1   No Known Allergies    The patient states she uses post menopausal status for birth control. Last LMP was No LMP recorded. Patient is postmenopausal.. Negative for Dysmenorrhea. Negative for: breast discharge, breast lump(s), breast pain and breast self exam. Associated symptoms include abnormal vaginal bleeding. Pertinent negatives include abnormal bleeding (hematology), anxiety, decreased libido, depression, difficulty falling sleep, dyspareunia, history of infertility, nocturia, sexual dysfunction, sleep  disturbances, urinary incontinence, urinary urgency, vaginal discharge and vaginal itching. Diet regular.The patient states her exercise level is  intermittent; although most recently she has been active more regularly.   . The patient's tobacco use is:  Social History   Tobacco Use  Smoking Status Current Every Day Smoker  . Packs/day: 0.25  . Years: 45.00  . Pack years: 11.25  . Types: Cigarettes  Smokeless Tobacco Never Used  Tobacco Comment   decrease number of cigs smoked per day  . She has been exposed to passive smoke. The patient's alcohol use is:  Social History   Substance and Sexual Activity  Alcohol Use Yes  . Alcohol/week: 1.0 standard drink  . Types: 1 Cans of beer per week   Comment: daily     Review of Systems  Constitutional: Negative.   HENT: Negative.   Eyes: Negative.  Negative for blurred vision.  Respiratory: Negative.  Negative for shortness of breath.   Cardiovascular: Negative.  Negative for chest pain and palpitations.  Gastrointestinal: Negative.   Endocrine: Negative.   Genitourinary: Negative.   Musculoskeletal: Positive for myalgias.  Skin: Negative.   Allergic/Immunologic: Negative.   Neurological: Negative.   Hematological: Negative.   Psychiatric/Behavioral: Negative.      Today's Vitals   05/16/20 1046  BP: 116/84  Pulse: 69  Temp: 98 F (36.7 C)  TempSrc: Oral  Weight: 191 lb 12.8 oz (87 kg)  Height: 5' 3.4" (1.61 m)   Body mass index is 33.55 kg/m.  Wt Readings from Last 3 Encounters:  05/16/20 191 lb 12.8 oz (87 kg)  12/19/19 190 lb (86.2 kg)  11/15/19 190 lb 3.2 oz (86.3 kg)   Objective:  Physical Exam Vitals and nursing note reviewed.  Constitutional:      General: She is not in acute distress.    Appearance: Normal appearance. She is well-developed.  HENT:     Head: Normocephalic and atraumatic.     Right Ear: Hearing, tympanic membrane, ear canal and external ear normal. There is no impacted cerumen.     Left  Ear: Hearing, tympanic membrane, ear canal and external ear normal. There is no impacted cerumen.     Nose:     Comments: Masked     Mouth/Throat:     Comments: Masked  Eyes:     General: Lids are normal.     Extraocular Movements: Extraocular movements intact.     Conjunctiva/sclera: Conjunctivae normal.     Comments: Irregular pupils  Neck:     Thyroid: No thyroid mass.     Vascular: No carotid bruit.  Cardiovascular:     Rate and Rhythm: Normal rate and regular rhythm.     Pulses: Normal pulses.     Heart sounds: Normal heart sounds. No murmur heard.   Pulmonary:     Effort: Pulmonary effort is normal.     Breath sounds: Normal breath sounds.  Chest:  Breasts:     Tanner Score is 5.  Right: Normal.     Left: Normal.    Abdominal:     General: Abdomen is flat. Bowel sounds are normal. There is no distension.     Palpations: Abdomen is soft.     Tenderness: There is no abdominal tenderness.  Genitourinary:    Comments: deferred Musculoskeletal:        General: No swelling. Normal range of motion.     Cervical back: Full passive range of motion without pain, normal range of motion and neck supple.     Right lower leg: No edema.     Left lower leg: No edema.  Skin:    General: Skin is warm and dry.     Capillary Refill: Capillary refill takes less than 2 seconds.  Neurological:     General: No focal deficit present.     Mental Status: She is alert and oriented to person, place, and time.     Cranial Nerves: No cranial nerve deficit.     Sensory: No sensory deficit.  Psychiatric:        Mood and Affect: Mood normal.        Behavior: Behavior normal.        Thought Content: Thought content normal.        Judgment: Judgment normal.         Assessment And Plan:     1. Routine general medical examination at health care facility Comments: A full exam was performed. Importance of monthly self breast exams was discussed with the patient.  She is fully vaccinated  and boosted.  I will refer her to Dr. Garwin Brothers for GYN care as requested. PATIENT IS ADVISED TO GET 30-45 MINUTES REGULAR EXERCISE NO LESS THAN FOUR TO FIVE DAYS PER WEEK - BOTH WEIGHTBEARING EXERCISES AND AEROBIC ARE RECOMMENDED.  PATIENT IS ADVISED TO FOLLOW A HEALTHY DIET WITH AT LEAST SIX FRUITS/VEGGIES PER DAY, DECREASE INTAKE OF RED MEAT, AND TO INCREASE FISH INTAKE TO TWO DAYS PER WEEK.  MEATS/FISH SHOULD NOT BE FRIED, BAKED OR BROILED IS PREFERABLE.  IT IS ALSO IMPORTANT TO CUT BACK ON YOUR SUGAR INTAKE. PLEASE AVOID ANYTHING WITH ADDED SUGAR, CORN SYRUP OR OTHER SWEETENERS. IF YOU MUST USE A SWEETENER, YOU CAN TRY STEVIA. IT IS ALSO IMPORTANT TO AVOID ARTIFICIALLY SWEETENERS AND DIET BEVERAGES. LASTLY, I SUGGEST WEARING SPF 50 SUNSCREEN ON EXPOSED PARTS AND ESPECIALLY WHEN IN THE DIRECT SUNLIGHT FOR AN EXTENDED PERIOD OF TIME.  PLEASE AVOID FAST FOOD RESTAURANTS AND INCREASE YOUR WATER INTAKE.  - Hemoglobin A1c - CBC - CMP14+EGFR - Lipid panel - Ambulatory referral to Gynecology  2. Essential hypertension, benign Comments: Chronic, well controlled. EKG performed, NSR w/o acute changes. Advised to avoid adding salt to her foods. She will f/u in six months.  - POCT Urinalysis Dipstick (81002) - POCT UA - Microalbumin - EKG 12-Lead  3. Pure hypercholesterolemia Comments: Chronic. Off of meds for now. Will consider rosuvastatin 70m MWF dosing in the future OR consider PCSK9 inhibitor therapy.   4. Myalgia Comments: Sx improved after stopping statin therapy. She is encouraged to stay well hydrated. She may benefit from CoQ10 supplementation.   5. Thyroid nodule Comments: Chronic. I will schedule her for thyroid ultrasound.  - TSH  6. Class 1 obesity due to excess calories with serious comorbidity and body mass index (BMI) of 33.0 to 33.9 in adult She is encouraged to strive for BMI less than 30 to decrease cardiac risk. Advised to aim for at least 150 minutes of  exercise per  week.    Patient was given opportunity to ask questions. Patient verbalized understanding of the plan and was able to repeat key elements of the plan. All questions were answered to their satisfaction.   I, [name], MD, have reviewed all documentation for this visit. The documentation on 05/27/20 for the exam, diagnosis, procedures, and orders are all accurate and complete. THE PATIENT IS ENCOURAGED TO PRACTICE SOCIAL DISTANCING DUE TO THE COVID-19 PANDEMIC.

## 2020-05-16 NOTE — Patient Instructions (Signed)
Health Maintenance, Female Adopting a healthy lifestyle and getting preventive care are important in promoting health and wellness. Ask your health care provider about:  The right schedule for you to have regular tests and exams.  Things you can do on your own to prevent diseases and keep yourself healthy. What should I know about diet, weight, and exercise? Eat a healthy diet  Eat a diet that includes plenty of vegetables, fruits, low-fat dairy products, and lean protein.  Do not eat a lot of foods that are high in solid fats, added sugars, or sodium.   Maintain a healthy weight Body mass index (BMI) is used to identify weight problems. It estimates body fat based on height and weight. Your health care provider can help determine your BMI and help you achieve or maintain a healthy weight. Get regular exercise Get regular exercise. This is one of the most important things you can do for your health. Most adults should:  Exercise for at least 150 minutes each week. The exercise should increase your heart rate and make you sweat (moderate-intensity exercise).  Do strengthening exercises at least twice a week. This is in addition to the moderate-intensity exercise.  Spend less time sitting. Even light physical activity can be beneficial. Watch cholesterol and blood lipids Have your blood tested for lipids and cholesterol at 64 years of age, then have this test every 5 years. Have your cholesterol levels checked more often if:  Your lipid or cholesterol levels are high.  You are older than 64 years of age.  You are at high risk for heart disease. What should I know about cancer screening? Depending on your health history and family history, you may need to have cancer screening at various ages. This may include screening for:  Breast cancer.  Cervical cancer.  Colorectal cancer.  Skin cancer.  Lung cancer. What should I know about heart disease, diabetes, and high blood  pressure? Blood pressure and heart disease  High blood pressure causes heart disease and increases the risk of stroke. This is more likely to develop in people who have high blood pressure readings, are of African descent, or are overweight.  Have your blood pressure checked: ? Every 3-5 years if you are 18-39 years of age. ? Every year if you are 40 years old or older. Diabetes Have regular diabetes screenings. This checks your fasting blood sugar level. Have the screening done:  Once every three years after age 40 if you are at a normal weight and have a low risk for diabetes.  More often and at a younger age if you are overweight or have a high risk for diabetes. What should I know about preventing infection? Hepatitis B If you have a higher risk for hepatitis B, you should be screened for this virus. Talk with your health care provider to find out if you are at risk for hepatitis B infection. Hepatitis C Testing is recommended for:  Everyone born from 1945 through 1965.  Anyone with known risk factors for hepatitis C. Sexually transmitted infections (STIs)  Get screened for STIs, including gonorrhea and chlamydia, if: ? You are sexually active and are younger than 64 years of age. ? You are older than 64 years of age and your health care provider tells you that you are at risk for this type of infection. ? Your sexual activity has changed since you were last screened, and you are at increased risk for chlamydia or gonorrhea. Ask your health care provider   if you are at risk.  Ask your health care provider about whether you are at high risk for HIV. Your health care provider may recommend a prescription medicine to help prevent HIV infection. If you choose to take medicine to prevent HIV, you should first get tested for HIV. You should then be tested every 3 months for as long as you are taking the medicine. Pregnancy  If you are about to stop having your period (premenopausal) and  you may become pregnant, seek counseling before you get pregnant.  Take 400 to 800 micrograms (mcg) of folic acid every day if you become pregnant.  Ask for birth control (contraception) if you want to prevent pregnancy. Osteoporosis and menopause Osteoporosis is a disease in which the bones lose minerals and strength with aging. This can result in bone fractures. If you are 65 years old or older, or if you are at risk for osteoporosis and fractures, ask your health care provider if you should:  Be screened for bone loss.  Take a calcium or vitamin D supplement to lower your risk of fractures.  Be given hormone replacement therapy (HRT) to treat symptoms of menopause. Follow these instructions at home: Lifestyle  Do not use any products that contain nicotine or tobacco, such as cigarettes, e-cigarettes, and chewing tobacco. If you need help quitting, ask your health care provider.  Do not use street drugs.  Do not share needles.  Ask your health care provider for help if you need support or information about quitting drugs. Alcohol use  Do not drink alcohol if: ? Your health care provider tells you not to drink. ? You are pregnant, may be pregnant, or are planning to become pregnant.  If you drink alcohol: ? Limit how much you use to 0-1 drink a day. ? Limit intake if you are breastfeeding.  Be aware of how much alcohol is in your drink. In the [place] one drink equals one 12 oz bottle of beer (355 mL), one 5 oz glass of wine (148 mL), or one 1 oz glass of hard liquor (44 mL). General instructions  Schedule regular health, dental, and eye exams.  Stay current with your vaccines.  Tell your health care provider if: ? You often feel depressed. ? You have ever been abused or do not feel safe at home. Summary  Adopting a healthy lifestyle and getting preventive care are important in promoting health and wellness.  Follow your health care provider's instructions about healthy  diet, exercising, and getting tested or screened for diseases.  Follow your health care provider's instructions on monitoring your cholesterol and blood pressure. This information is not intended to replace advice given to you by your health care provider. Make sure you discuss any questions you have with your health care provider. Document Revised: 02/09/2018 Document Reviewed: 02/09/2018 Elsevier Patient Education  2021 [employer] Inc.  

## 2020-05-17 LAB — CMP14+EGFR
ALT: 22 IU/L (ref 0–32)
AST: 17 IU/L (ref 0–40)
Albumin/Globulin Ratio: 1.9 (ref 1.2–2.2)
Albumin: 4.6 g/dL (ref 3.8–4.8)
Alkaline Phosphatase: 79 IU/L (ref 44–121)
BUN/Creatinine Ratio: 16 (ref 12–28)
BUN: 15 mg/dL (ref 8–27)
Bilirubin Total: 0.3 mg/dL (ref 0.0–1.2)
CO2: 23 mmol/L (ref 20–29)
Calcium: 10.1 mg/dL (ref 8.7–10.3)
Chloride: 103 mmol/L (ref 96–106)
Creatinine, Ser: 0.96 mg/dL (ref 0.57–1.00)
Globulin, Total: 2.4 g/dL (ref 1.5–4.5)
Glucose: 86 mg/dL (ref 65–99)
Potassium: 4.1 mmol/L (ref 3.5–5.2)
Sodium: 141 mmol/L (ref 134–144)
Total Protein: 7 g/dL (ref 6.0–8.5)
eGFR: 66 mL/min/{1.73_m2} (ref >59)

## 2020-05-17 LAB — LIPID PANEL
Chol/HDL Ratio: 6.5 ratio — ABNORMAL HIGH (ref 0.0–4.4)
Cholesterol, Total: 287 mg/dL — ABNORMAL HIGH (ref 100–199)
HDL: 44 mg/dL (ref >39)
LDL Chol Calc (NIH): 209 mg/dL — ABNORMAL HIGH (ref 0–99)
Triglycerides: 176 mg/dL — ABNORMAL HIGH (ref 0–149)
VLDL Cholesterol Cal: 34 mg/dL (ref 5–40)

## 2020-05-17 LAB — HEMOGLOBIN A1C
Est. average glucose Bld gHb Est-mCnc: 140 mg/dL
Hgb A1c MFr Bld: 6.5 % — ABNORMAL HIGH (ref 4.8–5.6)

## 2020-05-17 LAB — CBC
Hematocrit: 38.7 % (ref 34.0–46.6)
Hemoglobin: 13.2 g/dL (ref 11.1–15.9)
MCH: 31.3 pg (ref 26.6–33.0)
MCHC: 34.1 g/dL (ref 31.5–35.7)
MCV: 92 fL (ref 79–97)
Platelets: 331 10*3/uL (ref 150–450)
RBC: 4.22 x10E6/uL (ref 3.77–5.28)
RDW: 14.7 % (ref 11.7–15.4)
WBC: 9.3 10*3/uL (ref 3.4–10.8)

## 2020-05-17 LAB — TSH: TSH: 0.472 u[IU]/mL (ref 0.450–4.500)

## 2020-05-19 ENCOUNTER — Encounter: Payer: Self-pay | Admitting: Internal Medicine

## 2020-05-21 LAB — T4, FREE: Free T4: 1.18 ng/dL (ref 0.82–1.77)

## 2020-05-21 LAB — SPECIMEN STATUS REPORT

## 2020-06-12 ENCOUNTER — Other Ambulatory Visit: Payer: BC Managed Care – PPO

## 2020-06-26 ENCOUNTER — Other Ambulatory Visit: Payer: Self-pay

## 2020-06-26 ENCOUNTER — Ambulatory Visit
Admission: RE | Admit: 2020-06-26 | Discharge: 2020-06-26 | Disposition: A | Discharge status: Home / Self Care | Payer: Self-pay | Source: Ambulatory Visit | Attending: Internal Medicine | Admitting: Internal Medicine

## 2020-06-26 DIAGNOSIS — E041 Nontoxic single thyroid nodule: Secondary | ICD-10-CM

## 2020-09-18 ENCOUNTER — Other Ambulatory Visit: Payer: Self-pay | Admitting: Internal Medicine

## 2020-09-18 DIAGNOSIS — Z1231 Encounter for screening mammogram for malignant neoplasm of breast: Secondary | ICD-10-CM

## 2020-09-21 ENCOUNTER — Other Ambulatory Visit: Payer: Self-pay | Admitting: Internal Medicine

## 2020-11-18 ENCOUNTER — Encounter: Payer: Self-pay | Admitting: Internal Medicine

## 2020-11-18 ENCOUNTER — Ambulatory Visit: Payer: BC Managed Care – PPO | Admitting: Internal Medicine

## 2020-11-18 ENCOUNTER — Other Ambulatory Visit: Payer: Self-pay

## 2020-11-18 VITALS — BP 126/78 | HR 76 | Temp 98.9°F | Ht 63.2 in | Wt 187.0 lb

## 2020-11-18 DIAGNOSIS — E6609 Other obesity due to excess calories: Secondary | ICD-10-CM

## 2020-11-18 DIAGNOSIS — I10 Essential (primary) hypertension: Secondary | ICD-10-CM

## 2020-11-18 DIAGNOSIS — E66811 Obesity, class 1: Secondary | ICD-10-CM

## 2020-11-18 DIAGNOSIS — Z23 Encounter for immunization: Secondary | ICD-10-CM

## 2020-11-18 DIAGNOSIS — Z6832 Body mass index (BMI) 32.0-32.9, adult: Secondary | ICD-10-CM

## 2020-11-18 DIAGNOSIS — R7309 Other abnormal glucose: Secondary | ICD-10-CM

## 2020-11-18 DIAGNOSIS — E041 Nontoxic single thyroid nodule: Secondary | ICD-10-CM | POA: Diagnosis not present

## 2020-11-18 NOTE — Patient Instructions (Signed)

## 2020-11-18 NOTE — Progress Notes (Signed)
I,Christina Anthony,acting as a Education administrator for Christina Greenland, MD.,have documented all relevant documentation on the behalf of Christina Greenland, MD,as directed by  Christina Greenland, MD while in the presence of Christina Greenland, MD.  This visit occurred during the SARS-CoV-2 public health emergency.  Safety protocols were in place, including screening questions prior to the visit, additional usage of staff PPE, and extensive cleaning of exam room while observing appropriate contact time as indicated for disinfecting solutions.  Subjective:     Patient ID: Christina Anthony , female    DOB: [birth year] , 65 y.o.   MRN: [id number]   Chief Complaint  Patient presents with   Hypertension    HPI  The patient is here today for a follow-up on her blood pressure.  She reports compliance with meds. She denies headaches, chest pain and shortness of breath. She is happy to state that she is still participating in water aerobics.   Hypertension This is a chronic problem. The current episode started more than 1 year ago. The problem has been gradually improving since onset. The problem is controlled. Pertinent negatives include no blurred vision, chest pain, palpitations or shortness of breath. Risk factors for coronary artery disease include post-menopausal state and sedentary lifestyle. Past treatments include diuretics. The current treatment provides moderate improvement. Compliance problems include exercise.     Past Medical History:  Diagnosis Date   Chronic uveitis of both eyes    GERD (gastroesophageal reflux disease)    no meds currently   Glaucoma (increased eye pressure)    Goiter    Hypertension      Family History  Problem Relation Age of Onset   Diabetes Mother    Hypertension Mother    Glaucoma Mother    Hypertension Father    Cancer Sister      Current Outpatient Medications:    brimonidine (ALPHAGAN) 0.2 % ophthalmic solution, 1 drop 3 (three) times daily. 1 drop tid right  eye, bid left eye, Disp: , Rfl:    cetirizine (ZYRTEC) 10 MG tablet, Take 10 mg by mouth daily., Disp: , Rfl:    Cholecalciferol (VITAMIN D3) 25 MCG (1000 UT) CAPS, Take by mouth., Disp: , Rfl:    dorzolamide-timolol (COSOPT) 22.3-6.8 MG/ML ophthalmic solution, Place 1 drop into both eyes 2 (two) times daily., Disp: , Rfl:    Folic Acid 5 MG CAPS, daily. , Disp: , Rfl:    methotrexate (RHEUMATREX) 2.5 MG tablet, 2.5 mg. 8 tabs weekly, Disp: , Rfl:    Multiple Vitamin (THERA) TABS, Take by mouth., Disp: , Rfl:    Multiple Vitamins-Minerals (CENTRUM SILVER 50+WOMEN) TABS, Take by mouth., Disp: , Rfl:    Omega-3 Fatty Acids (FISH OIL PO), Take by mouth., Disp: , Rfl:    omeprazole (PRILOSEC) 20 MG capsule, Take by mouth., Disp: , Rfl:    pravastatin (PRAVACHOL) 80 MG tablet, Take 1 tablet (80 mg total) by mouth daily., Disp: 90 tablet, Rfl: 1   ROCKLATAN 0.02-0.005 % SOLN, Apply 1 drop to eye at bedtime., Disp: , Rfl:    triamterene-hydrochlorothiazide (MAXZIDE-25) 37.5-25 MG tablet, TAKE 1 TABLET BY MOUTH EVERY DAY, Disp: 90 tablet, Rfl: 2   TURMERIC PO, Take 1 capsule by mouth daily., Disp: , Rfl:    No Known Allergies   Review of Systems  Constitutional: Negative.   Eyes:  Negative for blurred vision.  Respiratory: Negative.  Negative for shortness of breath.   Cardiovascular: Negative.  Negative for chest  pain and palpitations.  Neurological: Negative.   Psychiatric/Behavioral: Negative.      Today's Vitals   11/18/20 1013  BP: 126/78  Pulse: 76  Temp: 98.9 F (37.2 C)  Weight: 187 lb (84.8 kg)  Height: 5' 3.2" (1.605 m)  PainSc: 0-No pain   Body mass index is 32.92 kg/m.  Wt Readings from Last 3 Encounters:  11/18/20 187 lb (84.8 kg)  05/16/20 191 lb 12.8 oz (87 kg)  12/19/19 190 lb (86.2 kg)     Objective:  Physical Exam Vitals and nursing note reviewed.  Constitutional:      Appearance: Normal appearance.  HENT:     Head: Normocephalic and atraumatic.     Nose:      Comments: Masked     Mouth/Throat:     Comments: Masked  Eyes:     Extraocular Movements: Extraocular movements intact.  Cardiovascular:     Rate and Rhythm: Normal rate and regular rhythm.     Heart sounds: Normal heart sounds.  Pulmonary:     Effort: Pulmonary effort is normal.     Breath sounds: Normal breath sounds.  Musculoskeletal:     Cervical back: Normal range of motion.  Skin:    General: Skin is warm.  Neurological:     General: No focal deficit present.     Mental Status: She is alert.  Psychiatric:        Mood and Affect: Mood normal.        Behavior: Behavior normal.        Assessment And Plan:     1. Essential hypertension, benign Comments: Chronic, well controlled. She will continue with current meds, no med changes. Encouraged to follow low sodium diet.  - CMP14+EGFR  2. Thyroid nodule Comments: Unfortunately, she has yet to have biopsy on suspicious nodule due to caring for her sister. Things have calmed down, so she plans to call ofc for appt.  - TSH  3. Other abnormal glucose Comments: Her hba1c was 6.5 in March 2022. I will check repeat lab today, encouraged to limit her intake of sweetened beverages.  - Hemoglobin A1c  4. Class 1 obesity due to excess calories with serious comorbidity and body mass index (BMI) of 32.0 to 32.9 in adult Comments: She was congratulated on her 4 lbs. weight loss since March 2022. She is encouraged to keep up the great work!   5. Immunization due - Flu Vaccine QUAD 6+ mos PF IM (Fluarix Quad PF)    Patient was given opportunity to ask questions. Patient verbalized understanding of the plan and was able to repeat key elements of the plan. All questions were answered to their satisfaction.   I, Christina Greenland, MD, have reviewed all documentation for this visit. The documentation on 11/18/20 for the exam, diagnosis, procedures, and orders are all accurate and complete.   IF YOU HAVE BEEN REFERRED TO A SPECIALIST,  IT MAY TAKE 1-2 WEEKS TO SCHEDULE/PROCESS THE REFERRAL. IF YOU HAVE NOT HEARD FROM US/SPECIALIST IN TWO WEEKS, PLEASE GIVE [place] A CALL AT [phone].   THE PATIENT IS ENCOURAGED TO PRACTICE SOCIAL DISTANCING DUE TO THE COVID-19 PANDEMIC.

## 2020-11-19 LAB — TSH: TSH: 0.495 u[IU]/mL (ref 0.450–4.500)

## 2020-11-19 LAB — CMP14+EGFR
ALT: 25 IU/L (ref 0–32)
AST: 21 IU/L (ref 0–40)
Albumin/Globulin Ratio: 1.7 (ref 1.2–2.2)
Albumin: 4.5 g/dL (ref 3.8–4.8)
Alkaline Phosphatase: 76 IU/L (ref 44–121)
BUN/Creatinine Ratio: 12 (ref 12–28)
BUN: 12 mg/dL (ref 8–27)
Bilirubin Total: 0.3 mg/dL (ref 0.0–1.2)
CO2: 26 mmol/L (ref 20–29)
Calcium: 10.1 mg/dL (ref 8.7–10.3)
Chloride: 101 mmol/L (ref 96–106)
Creatinine, Ser: 0.97 mg/dL (ref 0.57–1.00)
Globulin, Total: 2.6 g/dL (ref 1.5–4.5)
Glucose: 100 mg/dL — ABNORMAL HIGH (ref 65–99)
Potassium: 4 mmol/L (ref 3.5–5.2)
Sodium: 141 mmol/L (ref 134–144)
Total Protein: 7.1 g/dL (ref 6.0–8.5)
eGFR: 65 mL/min/{1.73_m2} (ref >59)

## 2020-11-19 LAB — HEMOGLOBIN A1C
Est. average glucose Bld gHb Est-mCnc: 143 mg/dL
Hgb A1c MFr Bld: 6.6 % — ABNORMAL HIGH (ref 4.8–5.6)

## 2020-11-20 ENCOUNTER — Other Ambulatory Visit: Payer: Self-pay

## 2020-11-20 DIAGNOSIS — E1165 Type 2 diabetes mellitus with hyperglycemia: Secondary | ICD-10-CM

## 2020-12-05 ENCOUNTER — Ambulatory Visit
Admission: RE | Admit: 2020-12-05 | Discharge: 2020-12-05 | Disposition: A | Discharge status: Home / Self Care | Payer: BC Managed Care – PPO | Source: Ambulatory Visit | Attending: Internal Medicine | Admitting: Internal Medicine

## 2020-12-05 ENCOUNTER — Other Ambulatory Visit: Payer: Self-pay | Admitting: Internal Medicine

## 2020-12-05 ENCOUNTER — Other Ambulatory Visit: Payer: Self-pay

## 2020-12-05 DIAGNOSIS — Z1231 Encounter for screening mammogram for malignant neoplasm of breast: Secondary | ICD-10-CM

## 2020-12-31 ENCOUNTER — Encounter: Payer: BC Managed Care – PPO | Attending: Internal Medicine | Admitting: Dietician

## 2020-12-31 ENCOUNTER — Other Ambulatory Visit: Payer: Self-pay

## 2020-12-31 DIAGNOSIS — E119 Type 2 diabetes mellitus without complications: Secondary | ICD-10-CM | POA: Insufficient documentation

## 2021-01-03 ENCOUNTER — Other Ambulatory Visit: Payer: Self-pay

## 2021-01-07 ENCOUNTER — Encounter: Payer: Self-pay | Admitting: Dietician

## 2021-01-07 ENCOUNTER — Encounter: Payer: BC Managed Care – PPO | Admitting: Dietician

## 2021-01-07 ENCOUNTER — Other Ambulatory Visit: Payer: Self-pay

## 2021-01-07 DIAGNOSIS — E119 Type 2 diabetes mellitus without complications: Secondary | ICD-10-CM | POA: Diagnosis not present

## 2021-01-07 NOTE — Progress Notes (Signed)
Patient was seen on 12/31/2020 for the first of a series of three diabetes self-management courses at the [facility].  Patient Education Plan per assessed needs and concerns is to attend three course education program for Diabetes Self Management Education.  The following learning objectives were met by the patient during this class: Describe diabetes, types of diabetes and pathophysiology State some common risk factors for diabetes Defines the role of glucose and insulin Describe the relationship between diabetes and cardiovascular and other risks State the members of the Healthcare Team States the rationale for glucose monitoring and when to test State their individual Target Range State the importance of logging glucose readings and how to interpret the readings Identifies A1C target Explain the correlation between A1c and eAG values State symptoms and treatment of high blood glucose and low blood glucose Explain proper technique for glucose testing and identify proper sharps disposal  Handouts given during class include: How to Thrive:  A Guide for Your Journey with Diabetes by the ADA Meal Plan Card and carbohydrate content list Dietary intake form Low Sodium Flavoring Tips Types of Fats Dining Out Label reading Snack list The diabetes portion plate Diabetes Resources A1c to eAG Conversion Chart Blood Glucose Log Diabetes Recommended Care Schedule Support Group Diabetes Success Plan Core Class Satisfaction Survey   Follow-Up Plan: Attend core 2   

## 2021-01-14 ENCOUNTER — Other Ambulatory Visit: Payer: Self-pay

## 2021-01-14 ENCOUNTER — Encounter: Payer: Self-pay | Admitting: Dietician

## 2021-01-14 ENCOUNTER — Encounter: Payer: BC Managed Care – PPO | Admitting: Dietician

## 2021-01-14 DIAGNOSIS — E119 Type 2 diabetes mellitus without complications: Secondary | ICD-10-CM

## 2021-01-14 NOTE — Progress Notes (Signed)
Patient was seen on 01/07/2021 for the second of a series of three diabetes self-management courses at the [facility]. The following learning objectives were met by the patient during this class:  Describe the role of different macronutrients on glucose Explain how carbohydrates affect blood glucose State what foods contain the most carbohydrates Demonstrate carbohydrate counting Demonstrate how to read Nutrition Facts food label Describe effects of various fats on heart health Describe the importance of good nutrition for health and healthy eating strategies Describe techniques for managing your shopping, cooking and meal planning List strategies to follow meal plan when dining out Describe the effects of alcohol on glucose and how to use it safely  Goals:  Follow Diabetes Meal Plan as instructed  Aim to spread carbs evenly throughout the day  Aim for 3 meals per day and snacks as needed Include lean protein foods to meals/snacks  Monitor glucose levels as instructed by your doctor   Follow-Up Plan: Attend Core 3 Work towards following your personal food plan.

## 2021-01-19 ENCOUNTER — Encounter: Payer: Self-pay | Admitting: Dietician

## 2021-01-19 NOTE — Progress Notes (Signed)
Patient was seen on 01/14/2021 for the third of a series of three diabetes self-management courses at the [facility].   State the amount of activity recommended for healthy living Describe activities suitable for individual needs Identify ways to regularly incorporate activity into daily life Identify barriers to activity and ways to over come these barriers Identify diabetes medications being personally used and their primary action for lowering glucose and possible side effects Describe role of stress on blood glucose and develop strategies to address psychosocial issues Identify diabetes complications and ways to prevent them Explain how to manage diabetes during illness Evaluate success in meeting personal goal Establish 2-3 goals that they will plan to diligently work on  Goals:  I will count my carb choices at most meals and snacks I will eat less unhealthy fats   Your patient has identified these potential barriers to change:  Motivation  Your patient has identified their diabetes self-care support plan as  unknown    Plan:  Attend Support Group as desired

## 2021-02-06 IMAGING — US US RENAL
1 series · 14 of 25 positions shown · non-contrast
Comparison: None.

CLINICAL DATA: Initial evaluation for hematuria.

EXAM:
RENAL / URINARY TRACT ULTRASOUND COMPLETE

[Series 1: us renal · 0.18mm/px · 14 of 43 slices shown]
[im 1/43]
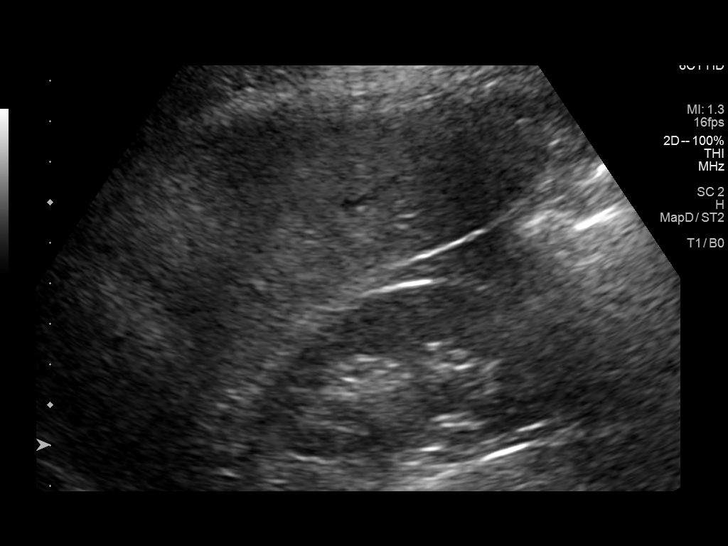
[im 4/43]
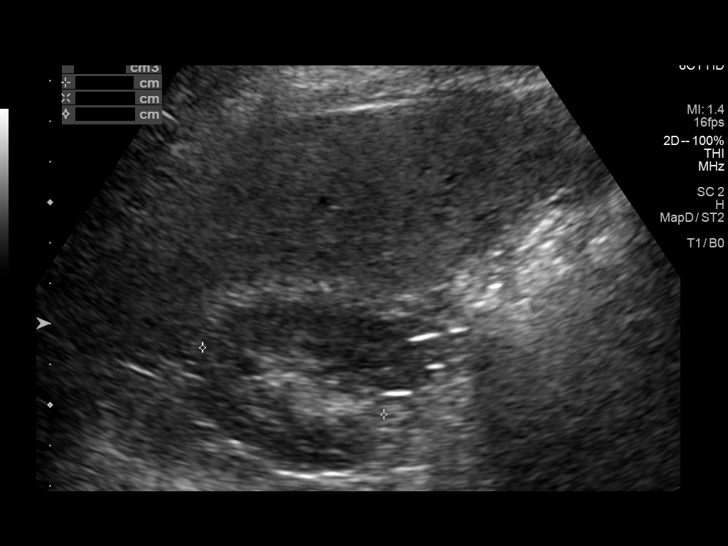
[im 8/43]
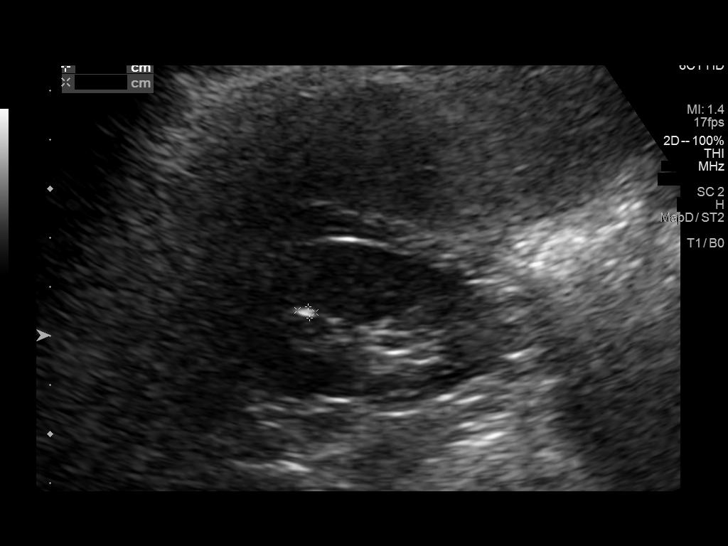
[im 11/43]
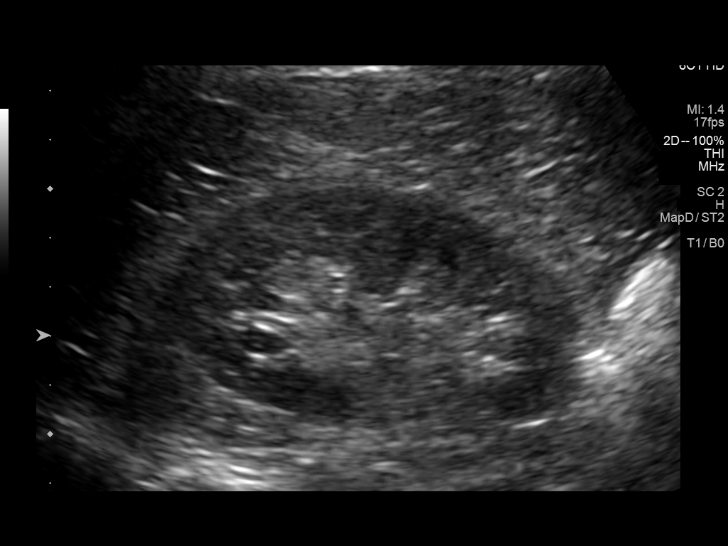
[im 15/43]
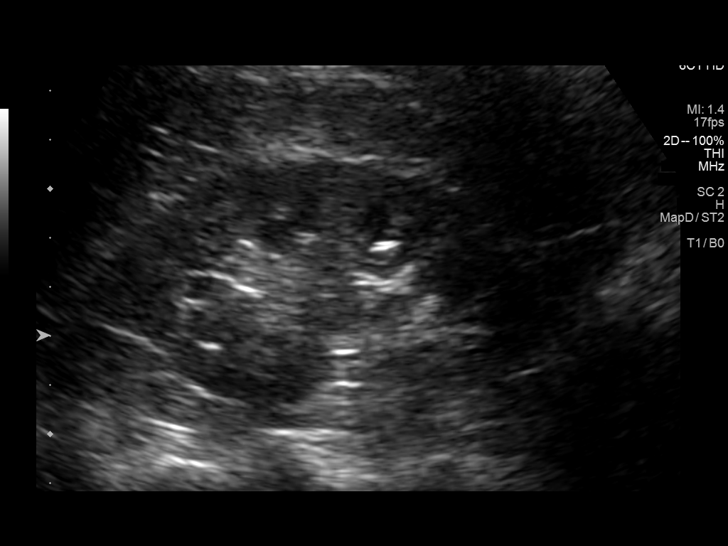
[im 16/43]
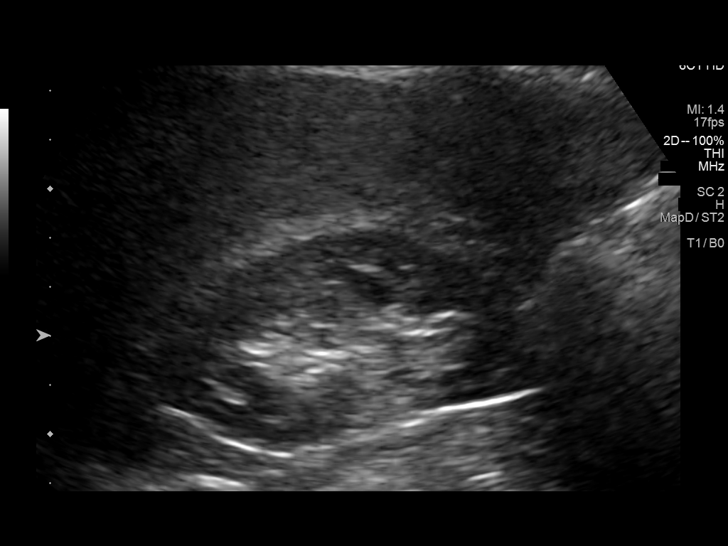
[im 20/43]
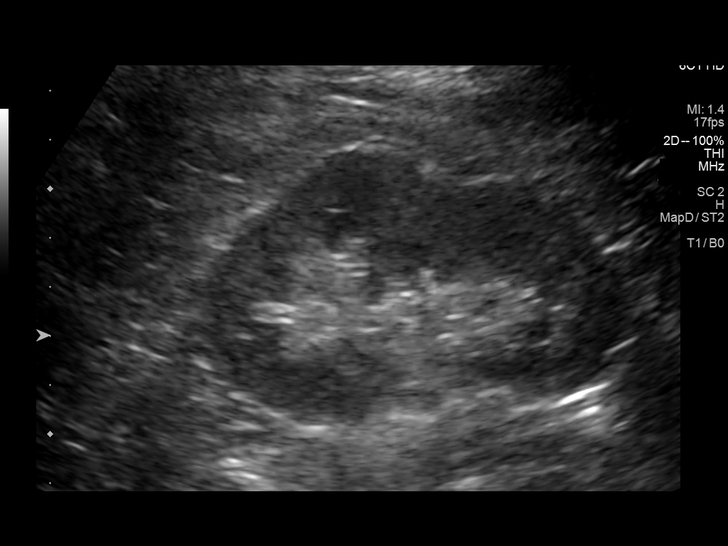
[im 23/43]
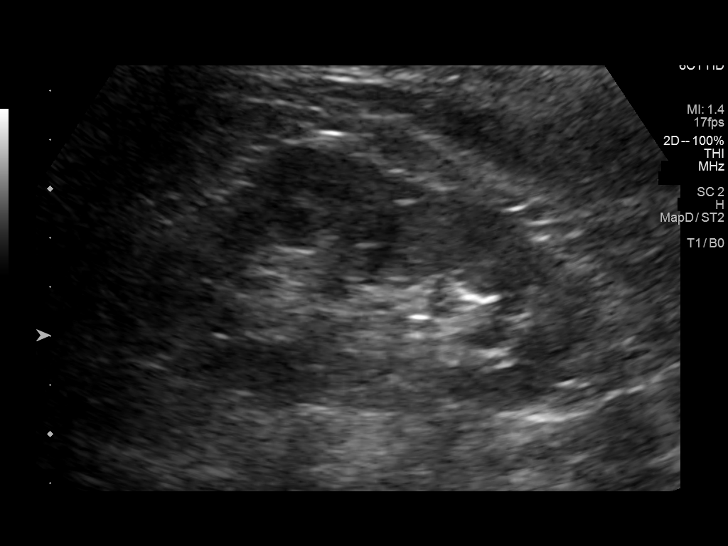
[im 27/43]
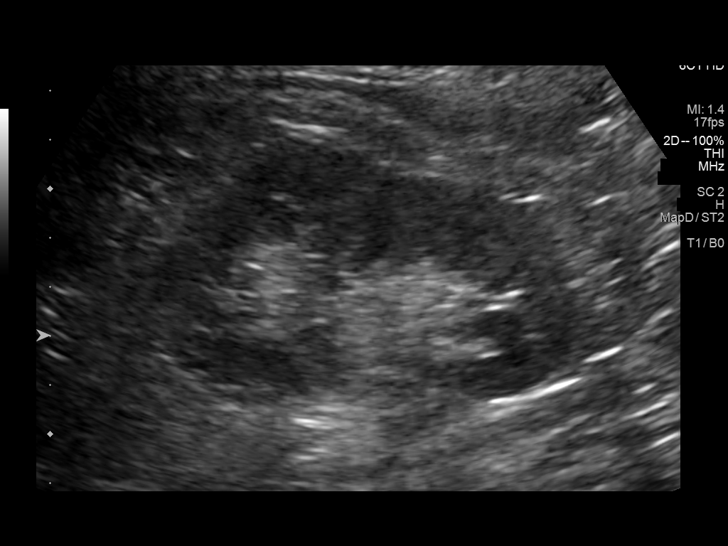
[im 29/43]
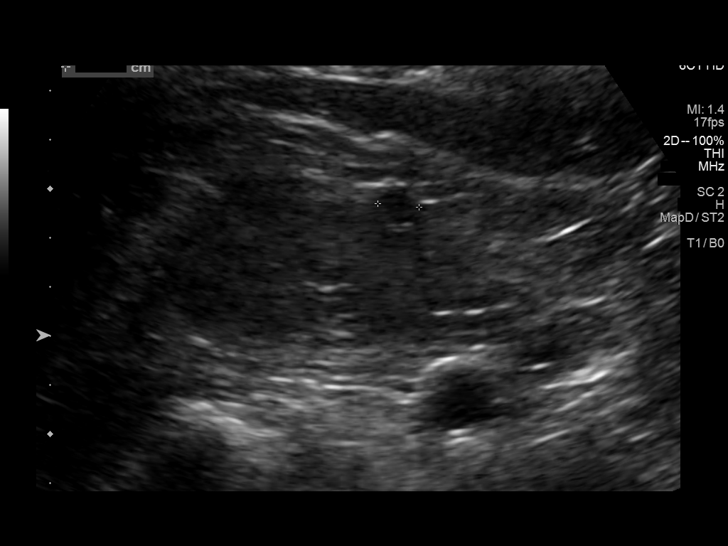
[im 32/43]
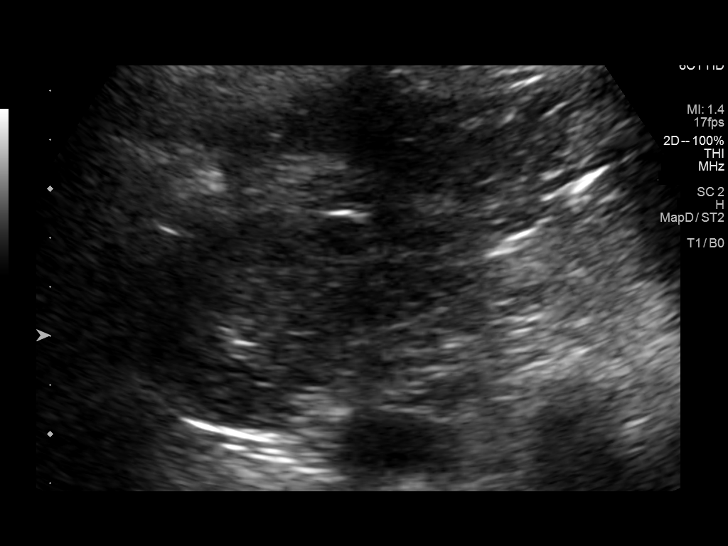
[im 36/43]
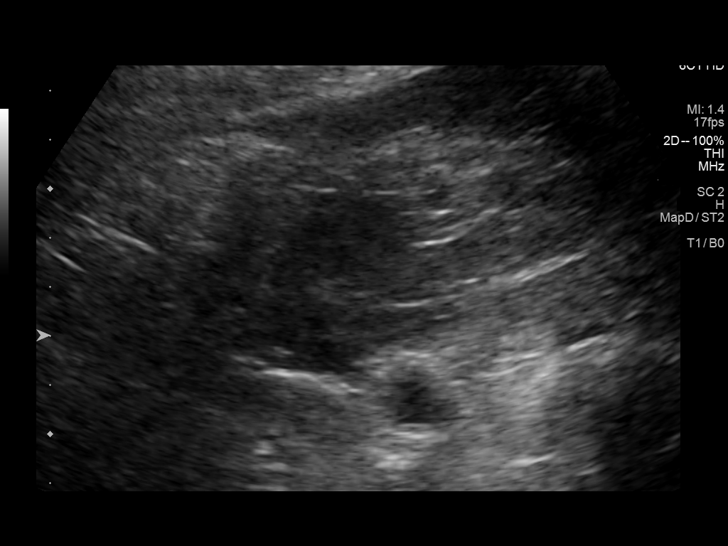
[im 39/43]
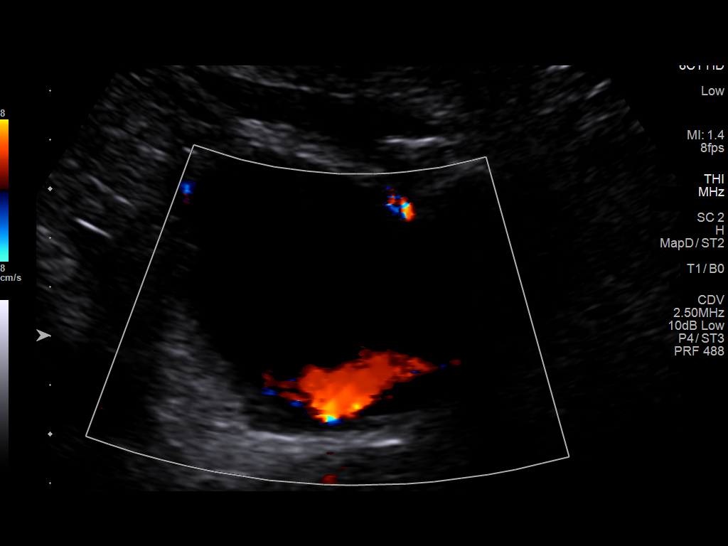
[im 43/43]
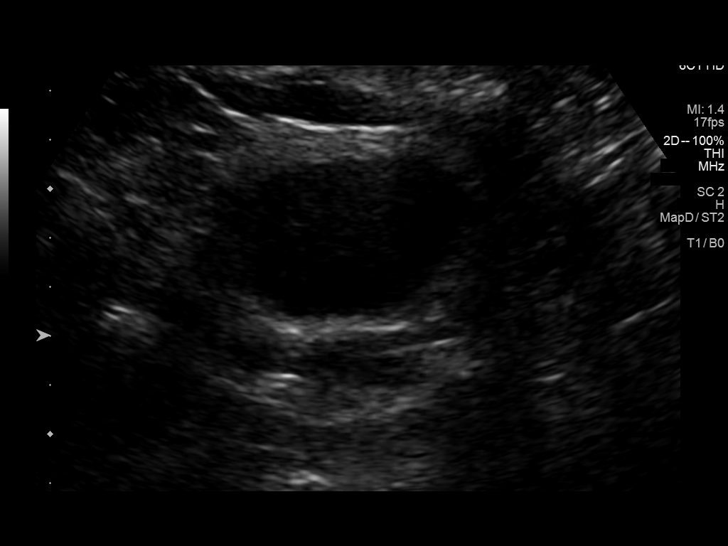

[14 of 25 positions shown; findings below may reference images not displayed]

FINDINGS: Right Kidney:

Renal measurements: 8.1 x 4.1 x 4.8 cm = volume: 82.5 mL.
Echogenicity within normal limits. 5 mm nonshadowing echogenic focus
present at the lower pole, suspicious for a small stone. No
hydronephrosis. No focal renal mass.

Left Kidney:

Renal measurements: 8.6 x 5.1 x 4.9 cm = volume: 113 mL.
Echogenicity within normal limits. No nephrolithiasis or
hydronephrosis. 9 mm simple cyst present at the lower pole.

Bladder:

Appears normal for degree of bladder distention.

Other:

None.
IMPRESSION: 1. 5 mm echogenic focus at the lower pole of the right kidney,
suspicious for a small nonobstructive stone.
2. No obstructive uropathy.
3. 9 mm simple left renal cyst.

## 2021-04-07 ENCOUNTER — Encounter: Payer: Self-pay | Admitting: Dietician

## 2021-04-07 ENCOUNTER — Encounter: Payer: BC Managed Care – PPO | Attending: Internal Medicine | Admitting: Dietician

## 2021-04-07 ENCOUNTER — Other Ambulatory Visit: Payer: Self-pay

## 2021-04-07 DIAGNOSIS — E119 Type 2 diabetes mellitus without complications: Secondary | ICD-10-CM | POA: Insufficient documentation

## 2021-04-07 NOTE — Patient Instructions (Addendum)
What can you do rather than snack at night when you are not hungry or at work when you are bored?  Seek and find puzzle book, walk around, go to bed  Instead of creamer consider using 2% milk or almond/soy milk in your coffee.  Continue to keep your vegetable intake high. Continue to cook at home most often.

## 2021-04-07 NOTE — Progress Notes (Signed)
Appointment start:  0930 Appointment End:  1010  Patient attended Diabetes Core Classes 1-3 between 12/31/2020 and 01/14/2021 at [facility]. The purpose of the meeting today is to review information learned during those classes as well as review patient application and goals.  She states that she stopped her sugar and sweets. 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 She is going to her water classes 1-2 times a week which decreased from 5 days per weeks since starting back to work. Her husband has diabetes and since he is exercising more and reducing sweets and has been able to come off insulin.  What are one or two positive things that you are doing right now to manage your diabetes?  Reducing her sugar, trying to stay active, cooking more at home rather than out to eat, increased vegetables including greens.  What is the hardest part about your diabetes right now, causing you the most concern, or is the most worrisome to you about your diabetes?  Temptation with food at times.  What questions do you have today?  None other than what has been covered  Have you participated in any diabetes support group?  no  History:  Type 2 Diabetes, HTN, thyroid A1C:  6.6% 11/18/2020 Medications include:  see list to include MVI and vitamin D Weight:   213 lbs 04/07/2021 gained weight as she is less active 190 lbs 01/07/2021  Blood Glucose:  not testing Social History:  Patient lives with her husband Exercise:  pool exercises, babysitting her great grandchildren (twins 3 months)  24 hour diet recall: Breakfast:  cereal (raisin bran or cheerios), banana OR oatmeal (cooked) with 1 tsp brown sugar, cinnamon and fruit Snack:  none Lunch (12:00):  [place] sandwich on [employer] bread, fruit Snack (2:30):  NABS Dinner:  meat, vegetable, baked sweet potato OR chili beans, slaw, cornbread OR greens and pintos OR meat and lima beans and  corn.   Snack:  small bag of chips Beverages:  water, coffee with powdered cream   Specific focus but not limited to the following: Review of carbohydrate counting, importance of regularly scheduled meals/snacks, and meal planning to improve quality of diet. Mindfulness with meals and snacks Choosing healthier fats Review of the effects of physical activity on glucose levels and long-term glucose control.  Recommended goal of 150 minutes of physical activity/week. Review of patient medications and discussed role of medication on blood glucose and possible side effects. Discussion of strategies to manage stress, psychosocial issues, and other obstacles to diabetes management.  Continuing Goals: What can you do rather than snack at night when you are not hungry or at work when you are bored?  Seek and find puzzle book, walk around, go to bed  Instead of creamer consider using 2% milk or almond/soy milk in your coffee.  Continue to keep your vegetable intake high. Continue to cook at home most often.  Future Follow up:  prn

## 2021-05-22 ENCOUNTER — Encounter: Payer: BC Managed Care – PPO | Admitting: Internal Medicine

## 2021-06-24 ENCOUNTER — Other Ambulatory Visit: Payer: Self-pay | Admitting: Internal Medicine

## 2021-10-01 ENCOUNTER — Encounter: Payer: Self-pay | Admitting: Internal Medicine

## 2021-10-01 ENCOUNTER — Ambulatory Visit (INDEPENDENT_AMBULATORY_CARE_PROVIDER_SITE_OTHER): Payer: BC Managed Care – PPO | Admitting: Internal Medicine

## 2021-10-01 VITALS — BP 120/80 | HR 84 | Temp 98.5°F | Ht 63.0 in | Wt 189.8 lb

## 2021-10-01 DIAGNOSIS — Z0000 Encounter for general adult medical examination without abnormal findings: Secondary | ICD-10-CM | POA: Diagnosis not present

## 2021-10-01 DIAGNOSIS — Z23 Encounter for immunization: Secondary | ICD-10-CM | POA: Diagnosis not present

## 2021-10-01 DIAGNOSIS — E7800 Pure hypercholesterolemia, unspecified: Secondary | ICD-10-CM | POA: Diagnosis not present

## 2021-10-01 DIAGNOSIS — I10 Essential (primary) hypertension: Secondary | ICD-10-CM | POA: Diagnosis not present

## 2021-10-01 DIAGNOSIS — F17210 Nicotine dependence, cigarettes, uncomplicated: Secondary | ICD-10-CM

## 2021-10-01 DIAGNOSIS — E041 Nontoxic single thyroid nodule: Secondary | ICD-10-CM

## 2021-10-01 DIAGNOSIS — E1165 Type 2 diabetes mellitus with hyperglycemia: Secondary | ICD-10-CM | POA: Diagnosis not present

## 2021-10-01 DIAGNOSIS — E6609 Other obesity due to excess calories: Secondary | ICD-10-CM

## 2021-10-01 DIAGNOSIS — Z6833 Body mass index (BMI) 33.0-33.9, adult: Secondary | ICD-10-CM

## 2021-10-01 LAB — POCT URINALYSIS DIPSTICK
Bilirubin, UA: NEGATIVE
Glucose, UA: POSITIVE — AB
Ketones, UA: NEGATIVE
Leukocytes, UA: NEGATIVE
Nitrite, UA: NEGATIVE
Protein, UA: NEGATIVE
Spec Grav, UA: 1.015 (ref 1.010–1.025)
Urobilinogen, UA: 0.2 E.U./dL
pH, UA: 7.5 (ref 5.0–8.0)

## 2021-10-01 NOTE — Patient Instructions (Signed)

## 2021-10-01 NOTE — Progress Notes (Signed)
Rich Brave Llittleton,acting as a Education administrator for [name], MD.,have documented all relevant documentation on the behalf of [name], MD,as directed by  [name], MD while in the presence of [name], MD.   Subjective:     Patient ID: Christina Anthony , female    DOB: 03/26/1955 , 66 y.o.   MRN: [id number]   Chief Complaint  Patient presents with   Annual Exam   Diabetes   Hypertension    HPI  She is here today for a full physical exam. She is followed by Dr. Garwin Brothers for her GYN exams. She has no specific concerns or complaints at this time.  At her last visit, labs revealed a1c above 6.5, she declined meds at that time. She has had dietary counseling/diabetic teaching at [facility].   Hypertension This is a chronic problem. The current episode started more than 1 year ago. The problem has been gradually improving since onset. The problem is controlled. Pertinent negatives include no blurred vision. Risk factors for coronary artery disease include post-menopausal state and sedentary lifestyle. Past treatments include diuretics. The current treatment provides moderate improvement. Compliance problems include exercise.      Past Medical History:  Diagnosis Date   Chronic uveitis of both eyes    Diabetes mellitus without complication (HCC)    GERD (gastroesophageal reflux disease)    no meds currently   Glaucoma (increased eye pressure)    Goiter    Hypertension      Family History  Problem Relation Age of Onset   Diabetes Mother    Hypertension Mother    Glaucoma Mother    Hypertension Father    Cancer Sister      Current Outpatient Medications:    brimonidine (ALPHAGAN) 0.2 % ophthalmic solution, 1 drop 3 (three) times daily. 1 drop tid right eye, bid left eye, Disp: , Rfl:    cetirizine (ZYRTEC) 10 MG tablet, Take 10 mg by mouth daily., Disp: , Rfl:    Cholecalciferol (VITAMIN D3) 25 MCG (1000 UT) CAPS, Take by mouth., Disp: , Rfl:     dorzolamide-timolol (COSOPT) 22.3-6.8 MG/ML ophthalmic solution, Place 1 drop into both eyes 2 (two) times daily., Disp: , Rfl:    Folic Acid 5 MG CAPS, daily. , Disp: , Rfl:    methotrexate (RHEUMATREX) 2.5 MG tablet, 2.5 mg. 8 tabs weekly, Disp: , Rfl:    Multiple Vitamin (THERA) TABS, Take by mouth., Disp: , Rfl:    Multiple Vitamins-Minerals (CENTRUM SILVER 50+WOMEN) TABS, Take by mouth., Disp: , Rfl:    Omega-3 Fatty Acids (FISH OIL PO), Take by mouth., Disp: , Rfl:    omeprazole (PRILOSEC) 20 MG capsule, Take by mouth., Disp: , Rfl:    pravastatin (PRAVACHOL) 80 MG tablet, TAKE 1 TABLET BY MOUTH EVERY DAY, Disp: 90 tablet, Rfl: 1   triamterene-hydrochlorothiazide (MAXZIDE-25) 37.5-25 MG tablet, TAKE 1 TABLET BY MOUTH EVERY DAY, Disp: 90 tablet, Rfl: 2   TURMERIC PO, Take 1 capsule by mouth daily., Disp: , Rfl:    Dapagliflozin-metFORMIN HCl ER (XIGDUO XR) 5-500 MG TB24, Take by mouth. (Patient not taking: Reported on 10/01/2021), Disp: , Rfl:    No Known Allergies    The patient states she uses post menopausal status for birth control. Last LMP was No LMP recorded. Patient is postmenopausal.. Negative for Dysmenorrhea. Negative for: breast discharge, breast lump(s), breast pain and breast self exam. Associated symptoms include abnormal vaginal bleeding. Pertinent negatives include abnormal  bleeding (hematology), anxiety, decreased libido, depression, difficulty falling sleep, dyspareunia, history of infertility, nocturia, sexual dysfunction, sleep disturbances, urinary incontinence, urinary urgency, vaginal discharge and vaginal itching. Diet regular.The patient states her exercise level is  INTERMITTENT.  . The patient's tobacco use is:  Social History   Tobacco Use  Smoking Status Every Day   Packs/day: 0.25   Years: 48.00   Total pack years: 12.00   Types: Cigarettes  Smokeless Tobacco Never  Tobacco Comments   decrease number of cigs smoked per day  . She has been exposed to  passive smoke. The patient's alcohol use is:  Social History   Substance and Sexual Activity  Alcohol Use Yes   Alcohol/week: 1.0 standard drink of alcohol   Types: 1 Cans of beer per week   Comment: daily   Review of Systems  Constitutional: Negative.   HENT: Negative.    Eyes: Negative.  Negative for blurred vision.  Respiratory: Negative.    Cardiovascular: Negative.   Gastrointestinal: Negative.   Endocrine: Negative.   Genitourinary: Negative.   Musculoskeletal: Negative.   Skin: Negative.   Allergic/Immunologic: Negative.   Neurological: Negative.   Hematological: Negative.   Psychiatric/Behavioral: Negative.       Today's Vitals   10/01/21 1522  BP: 120/80  Pulse: 84  Temp: 98.5 F (36.9 C)  Weight: 189 lb 12.8 oz (86.1 kg)  Height: 5' 3"  (1.6 m)  PainSc: 0-No pain   Body mass index is 33.62 kg/m.  Wt Readings from Last 3 Encounters:  10/01/21 189 lb 12.8 oz (86.1 kg)  01/07/21 190 lb (86.2 kg)  11/18/20 187 lb (84.8 kg)     Objective:  Physical Exam Vitals and nursing note reviewed.  Constitutional:      Appearance: Normal appearance.  HENT:     Head: Normocephalic and atraumatic.     Right Ear: Tympanic membrane, ear canal and external ear normal.     Left Ear: Tympanic membrane, ear canal and external ear normal.     Nose: Nose normal.     Mouth/Throat:     Mouth: Mucous membranes are moist.     Pharynx: Oropharynx is clear.  Eyes:     Extraocular Movements: Extraocular movements intact.     Conjunctiva/sclera: Conjunctivae normal.     Pupils: Pupils are equal, round, and reactive to light.     Comments: Pupillary scarring b/l, arcus senilis  Cardiovascular:     Rate and Rhythm: Normal rate and regular rhythm.     Pulses:          Dorsalis pedis pulses are 1+ on the right side and 2+ on the left side.     Heart sounds: Normal heart sounds.  Pulmonary:     Effort: Pulmonary effort is normal.     Breath sounds: Normal breath sounds.   Chest:  Breasts:    Tanner Score is 5.     Right: Normal.     Left: Normal.  Abdominal:     General: Bowel sounds are normal.     Palpations: Abdomen is soft.     Tenderness: There is no abdominal tenderness. There is no guarding or rebound.  Genitourinary:    Comments: deferred Musculoskeletal:        General: Normal range of motion.     Cervical back: Normal range of motion and neck supple.  Feet:     Right foot:     Protective Sensation: 5 sites tested.  5 sites sensed.  Skin integrity: Dry skin present.     Toenail Condition: Right toenails are normal.     Left foot:     Protective Sensation: 5 sites tested.  5 sites sensed.     Skin integrity: Dry skin present.     Toenail Condition: Left toenails are normal.  Skin:    General: Skin is warm and dry.  Neurological:     General: No focal deficit present.     Mental Status: She is alert and oriented to person, place, and time.  Psychiatric:        Mood and Affect: Mood normal.        Behavior: Behavior normal.       Assessment And Plan:     1. Encounter for general adult medical examination w/o abnormal findings Comments: A full exam was performed. Importance of monthly self breast exams was discussed with the patient.  PATIENT IS ADVISED TO GET 30-45 MINUTES REGULAR EXERCISE NO LESS THAN FOUR TO FIVE DAYS PER WEEK - BOTH WEIGHTBEARING EXERCISES AND AEROBIC ARE RECOMMENDED.  PATIENT IS ADVISED TO FOLLOW A HEALTHY DIET WITH AT LEAST SIX FRUITS/VEGGIES PER DAY, DECREASE INTAKE OF RED MEAT, AND TO INCREASE FISH INTAKE TO TWO DAYS PER WEEK.  MEATS/FISH SHOULD NOT BE FRIED, BAKED OR BROILED IS PREFERABLE.  IT IS ALSO IMPORTANT TO CUT BACK ON YOUR SUGAR INTAKE. PLEASE AVOID ANYTHING WITH ADDED SUGAR, CORN SYRUP OR OTHER SWEETENERS. IF YOU MUST USE A SWEETENER, YOU CAN TRY STEVIA. IT IS ALSO IMPORTANT TO AVOID ARTIFICIALLY SWEETENERS AND DIET BEVERAGES. LASTLY, I SUGGEST WEARING SPF 50 SUNSCREEN ON EXPOSED PARTS AND ESPECIALLY  WHEN IN THE DIRECT SUNLIGHT FOR AN EXTENDED PERIOD OF TIME.  PLEASE AVOID FAST FOOD RESTAURANTS AND INCREASE YOUR WATER INTAKE. - Urine microalbumin-creatinine with uACR - CBC - CMP14+EGFR - Lipid panel - Hemoglobin A1c  2. Uncontrolled type 2 diabetes mellitus with hyperglycemia (HCC) Comments: Diabetic foot exam performed. I plan to start ARB therapy once she is stable on DM meds. I will check labs as below and start meds as indicated.  She is also encouraged to notify her eye doctor of new diagnosis of diabetes. She will f/u in 3 months.  I DISCUSSED WITH THE PATIENT AT LENGTH REGARDING THE GOALS OF GLYCEMIC CONTROL AND POSSIBLE LONG-TERM COMPLICATIONS.  I  ALSO STRESSED THE IMPORTANCE OF COMPLIANCE WITH HOME GLUCOSE MONITORING, DIETARY RESTRICTIONS INCLUDING AVOIDANCE OF SUGARY DRINKS/PROCESSED FOODS,  ALONG WITH REGULAR EXERCISE.  I  ALSO STRESSED THE IMPORTANCE OF ANNUAL EYE EXAMS, SELF FOOT CARE AND COMPLIANCE WITH OFFICE VISITS.   3. Essential hypertension, benign Comments: Chronic, well controlled. EKG performed, NSR w/ low voltage in limbleads, nonspecific T abnormality. Encouraged to follow low sodium diet. F/u in 6 months.  - POCT Urinalysis Dipstick (81002) - EKG 12-Lead  4. Pure hypercholesterolemia Comments: Chronic, currently on pravastatin 65m daily. Goal LDL<70 with new diagnosis of diabetes.   5. Cigarette nicotine dependence without complication Comments: Chronic, she does not wish to quit at this time. She is encouraged to cut back on number of cigs smoked per day. Smoking cessation instruction/counseling given:  counseled patient on the dangers of tobacco use, advised patient to stop smoking, and reviewed strategies to maximize success   6. Thyroid nodule Comments: Previous thyroid u/s reviewed, did not pursue bx last year. Will schedule repeat u/s - pt advised she will likely still need thyroid bx.  - [place]; Future  7. Class 1 obesity due to excess calories with  serious comorbidity and body mass index (BMI) of 33.0 to 33.9 in adult Comments: She is encouraged to aim for at least 150 minutes of exercise per week.   8. Immunization due Comments: She was given Shingrix IM x 1.  - Varicella-zoster vaccine IM  Patient was given opportunity to ask questions. Patient verbalized understanding of the plan and was able to repeat key elements of the plan. All questions were answered to their satisfaction.   I, [name], MD, have reviewed all documentation for this visit. The documentation on 10/01/21 for the exam, diagnosis, procedures, and orders are all accurate and complete.   THE PATIENT IS ENCOURAGED TO PRACTICE SOCIAL DISTANCING DUE TO THE COVID-19 PANDEMIC.

## 2021-10-02 LAB — CMP14+EGFR
ALT: 17 IU/L (ref 0–32)
AST: 17 IU/L (ref 0–40)
Albumin/Globulin Ratio: 1.8 (ref 1.2–2.2)
Albumin: 4.4 g/dL (ref 3.9–4.9)
Alkaline Phosphatase: 75 IU/L (ref 44–121)
BUN/Creatinine Ratio: 18 (ref 12–28)
BUN: 17 mg/dL (ref 8–27)
Bilirubin Total: <0.2 mg/dL (ref 0.0–1.2)
CO2: 24 mmol/L (ref 20–29)
Calcium: 9.9 mg/dL (ref 8.7–10.3)
Chloride: 102 mmol/L (ref 96–106)
Creatinine, Ser: 0.97 mg/dL (ref 0.57–1.00)
Globulin, Total: 2.5 g/dL (ref 1.5–4.5)
Glucose: 92 mg/dL (ref 70–99)
Potassium: 3.7 mmol/L (ref 3.5–5.2)
Sodium: 140 mmol/L (ref 134–144)
Total Protein: 6.9 g/dL (ref 6.0–8.5)
eGFR: 65 mL/min/{1.73_m2} (ref >59)

## 2021-10-02 LAB — CBC
Hematocrit: 37.9 % (ref 34.0–46.6)
Hemoglobin: 12.9 g/dL (ref 11.1–15.9)
MCH: 29.9 pg (ref 26.6–33.0)
MCHC: 34 g/dL (ref 31.5–35.7)
MCV: 88 fL (ref 79–97)
Platelets: 363 10*3/uL (ref 150–450)
RBC: 4.31 x10E6/uL (ref 3.77–5.28)
RDW: 14.2 % (ref 11.7–15.4)
WBC: 10.6 10*3/uL (ref 3.4–10.8)

## 2021-10-02 LAB — LIPID PANEL
Chol/HDL Ratio: 6.9 ratio — ABNORMAL HIGH (ref 0.0–4.4)
Cholesterol, Total: 288 mg/dL — ABNORMAL HIGH (ref 100–199)
HDL: 42 mg/dL (ref >39)
LDL Chol Calc (NIH): 193 mg/dL — ABNORMAL HIGH (ref 0–99)
Triglycerides: 271 mg/dL — ABNORMAL HIGH (ref 0–149)
VLDL Cholesterol Cal: 53 mg/dL — ABNORMAL HIGH (ref 5–40)

## 2021-10-02 LAB — MICROALBUMIN / CREATININE URINE RATIO
Creatinine, Urine: 41.8 mg/dL
Microalb/Creat Ratio: <7 mg/g creat (ref 0–29)
Microalbumin, Urine: <3 ug/mL

## 2021-10-02 LAB — HEMOGLOBIN A1C
Est. average glucose Bld gHb Est-mCnc: 134 mg/dL
Hgb A1c MFr Bld: 6.3 % — ABNORMAL HIGH (ref 4.8–5.6)

## 2021-10-05 ENCOUNTER — Encounter: Payer: Self-pay | Admitting: Internal Medicine

## 2021-10-08 ENCOUNTER — Ambulatory Visit
Admission: RE | Admit: 2021-10-08 | Discharge: 2021-10-08 | Disposition: A | Discharge status: Home / Self Care | Payer: BC Managed Care – PPO | Source: Ambulatory Visit | Attending: Internal Medicine | Admitting: Internal Medicine

## 2021-10-08 ENCOUNTER — Other Ambulatory Visit: Payer: Self-pay | Admitting: Internal Medicine

## 2021-10-08 DIAGNOSIS — E041 Nontoxic single thyroid nodule: Secondary | ICD-10-CM

## 2021-10-10 ENCOUNTER — Other Ambulatory Visit: Payer: Self-pay

## 2021-10-10 MED ORDER — PRAVASTATIN SODIUM 80 MG PO TABS: 80.0000 mg | ORAL_TABLET | Freq: Every day | ORAL | 1 refills | Status: DC

## 2021-11-11 ENCOUNTER — Other Ambulatory Visit: Payer: Medicare Other

## 2021-11-11 DIAGNOSIS — E1165 Type 2 diabetes mellitus with hyperglycemia: Secondary | ICD-10-CM

## 2021-11-12 ENCOUNTER — Other Ambulatory Visit (HOSPITAL_COMMUNITY)
Admission: RE | Admit: 2021-11-12 | Discharge: 2021-11-12 | Disposition: A | Discharge status: Home / Self Care | Payer: Medicare Other | Source: Ambulatory Visit | Attending: Internal Medicine | Admitting: Internal Medicine

## 2021-11-12 ENCOUNTER — Ambulatory Visit
Admission: RE | Admit: 2021-11-12 | Discharge: 2021-11-12 | Disposition: A | Discharge status: Home / Self Care | Payer: Medicare Other | Source: Ambulatory Visit | Attending: Internal Medicine | Admitting: Internal Medicine

## 2021-11-12 DIAGNOSIS — E041 Nontoxic single thyroid nodule: Secondary | ICD-10-CM

## 2021-11-12 LAB — BMP8+EGFR
BUN/Creatinine Ratio: 14 (ref 12–28)
BUN: 12 mg/dL (ref 8–27)
CO2: 25 mmol/L (ref 20–29)
Calcium: 10 mg/dL (ref 8.7–10.3)
Chloride: 102 mmol/L (ref 96–106)
Creatinine, Ser: 0.87 mg/dL (ref 0.57–1.00)
Glucose: 133 mg/dL — ABNORMAL HIGH (ref 70–99)
Potassium: 3.6 mmol/L (ref 3.5–5.2)
Sodium: 141 mmol/L (ref 134–144)
eGFR: 74 mL/min/{1.73_m2} (ref >59)

## 2021-11-12 NOTE — Procedures (Signed)
PROCEDURE SUMMARY:  Using direct ultrasound guidance, 5 passes were made using 25 g needles into the nodule within the left mid lobe of the thyroid.   Ultrasound was used to confirm needle placements on all occasions.   EBL = trace  Specimens were sent to Pathology for analysis.  See procedure note under Imaging tab in Epic for full procedure details.  Driana Dazey S Treysen Sudbeck PA-C 11/12/2021 2:50 PM

## 2021-11-17 LAB — CYTOLOGY - NON PAP

## 2021-12-09 ENCOUNTER — Ambulatory Visit: Payer: BC Managed Care – PPO

## 2021-12-11 ENCOUNTER — Ambulatory Visit (INDEPENDENT_AMBULATORY_CARE_PROVIDER_SITE_OTHER): Payer: Medicare Other

## 2021-12-11 VITALS — BP 120/80 | HR 75 | Temp 98.4°F | Ht 63.0 in | Wt 189.0 lb

## 2021-12-11 DIAGNOSIS — Z23 Encounter for immunization: Secondary | ICD-10-CM | POA: Diagnosis not present

## 2021-12-11 NOTE — Progress Notes (Signed)
Patient presents today for a flu shot. YL,RMA 

## 2021-12-17 ENCOUNTER — Encounter: Payer: Self-pay | Admitting: Internal Medicine

## 2021-12-17 ENCOUNTER — Ambulatory Visit: Payer: Medicare Other | Admitting: Internal Medicine

## 2021-12-17 VITALS — BP 124/80 | HR 86 | Temp 98.3°F | Ht 63.0 in | Wt 188.4 lb

## 2021-12-17 DIAGNOSIS — L989 Disorder of the skin and subcutaneous tissue, unspecified: Secondary | ICD-10-CM

## 2021-12-17 DIAGNOSIS — Z6833 Body mass index (BMI) 33.0-33.9, adult: Secondary | ICD-10-CM

## 2021-12-17 DIAGNOSIS — Z23 Encounter for immunization: Secondary | ICD-10-CM | POA: Diagnosis not present

## 2021-12-17 DIAGNOSIS — E7800 Pure hypercholesterolemia, unspecified: Secondary | ICD-10-CM | POA: Diagnosis not present

## 2021-12-17 DIAGNOSIS — R0981 Nasal congestion: Secondary | ICD-10-CM

## 2021-12-17 DIAGNOSIS — E6609 Other obesity due to excess calories: Secondary | ICD-10-CM

## 2021-12-17 DIAGNOSIS — H938X3 Other specified disorders of ear, bilateral: Secondary | ICD-10-CM

## 2021-12-17 NOTE — Patient Instructions (Signed)

## 2021-12-17 NOTE — Progress Notes (Signed)
Barnet Glasgow Martin,acting as a Education administrator for [name], MD.,have documented all relevant documentation on the behalf of [name], MD,as directed by  [name], MD while in the presence of [name], MD.    Subjective:     Patient ID: Christina Anthony , female    DOB: 1955-08-21 , 66 y.o.   MRN: [id number]   Chief Complaint  Patient presents with   Hyperlipidemia    HPI  Patient presents today for a cholesterol check. Patient states compliance with medications, patient has no other issues today. She admits she was not taking it daily at her previous appt.   Patient wants a different referral to a dermatologist, the previous went out of business.   BP Readings from Last 3 Encounters: 12/17/21 : 124/80 12/11/21 : 120/80 10/01/21 : 120/80     Hyperlipidemia This is a chronic problem. The current episode started more than 1 year ago. Exacerbating diseases include diabetes. Current antihyperlipidemic treatment includes statins. Risk factors for coronary artery disease include dyslipidemia, diabetes mellitus and post-menopausal.     Past Medical History:  Diagnosis Date   Chronic uveitis of both eyes    Diabetes mellitus without complication (HCC)    GERD (gastroesophageal reflux disease)    no meds currently   Glaucoma (increased eye pressure)    Goiter    Hypertension      Family History  Problem Relation Age of Onset   Diabetes Mother    Hypertension Mother    Glaucoma Mother    Hypertension Father    Cancer Sister      Current Outpatient Medications:    brimonidine (ALPHAGAN) 0.2 % ophthalmic solution, 1 drop 3 (three) times daily. 1 drop tid right eye, bid left eye, Disp: , Rfl:    cetirizine (ZYRTEC) 10 MG tablet, Take 10 mg by mouth daily., Disp: , Rfl:    Cholecalciferol (VITAMIN D3) 25 MCG (1000 UT) CAPS, Take by mouth., Disp: , Rfl:    dorzolamide-timolol (COSOPT) 22.3-6.8 MG/ML ophthalmic solution, Place 1 drop into both eyes 2 (two)  times daily., Disp: , Rfl:    Folic Acid 5 MG CAPS, daily. , Disp: , Rfl:    methotrexate (RHEUMATREX) 2.5 MG tablet, 2.5 mg. 8 tabs weekly, Disp: , Rfl:    Multiple Vitamin (THERA) TABS, Take by mouth., Disp: , Rfl:    Multiple Vitamins-Minerals (CENTRUM SILVER 50+WOMEN) TABS, Take by mouth., Disp: , Rfl:    Omega-3 Fatty Acids (FISH OIL PO), Take by mouth., Disp: , Rfl:    omeprazole (PRILOSEC) 20 MG capsule, Take by mouth., Disp: , Rfl:    pravastatin (PRAVACHOL) 80 MG tablet, Take 1 tablet (80 mg total) by mouth daily., Disp: 90 tablet, Rfl: 1   triamterene-hydrochlorothiazide (MAXZIDE-25) 37.5-25 MG tablet, TAKE 1 TABLET BY MOUTH EVERY DAY, Disp: 90 tablet, Rfl: 2   TURMERIC PO, Take 1 capsule by mouth daily., Disp: , Rfl:    Dapagliflozin-metFORMIN HCl ER (XIGDUO XR) 5-500 MG TB24, Take by mouth. (Patient not taking: Reported on 10/01/2021), Disp: , Rfl:    No Known Allergies   Review of Systems  Constitutional: Negative.   HENT: Negative.    Eyes: Negative.   Respiratory: Negative.    Cardiovascular: Negative.   Gastrointestinal: Negative.      Today's Vitals   12/17/21 1144  BP: 124/80  Pulse: 86  Temp: 98.3 F (36.8 C)  TempSrc: Oral  Weight: 188 lb 6.4 oz (85.5 kg)  Height:  $'5\' 3"'M$  (1.6 m)  PainSc: 0-No pain   Body mass index is 33.37 kg/m.  Wt Readings from Last 3 Encounters:  12/17/21 188 lb 6.4 oz (85.5 kg)  12/11/21 189 lb (85.7 kg)  10/01/21 189 lb 12.8 oz (86.1 kg)    Objective:  Physical Exam Vitals and nursing note reviewed.  Constitutional:      Appearance: Normal appearance.  HENT:     Head: Normocephalic and atraumatic.     Nose:     Comments: Masked     Mouth/Throat:     Comments: Masked  Eyes:     Extraocular Movements: Extraocular movements intact.  Cardiovascular:     Rate and Rhythm: Normal rate and regular rhythm.     Heart sounds: Normal heart sounds.  Pulmonary:     Effort: Pulmonary effort is normal.     Breath sounds: Normal breath  sounds.  Musculoskeletal:     Cervical back: Normal range of motion.  Skin:    General: Skin is warm.  Neurological:     General: No focal deficit present.     Mental Status: She is alert.  Psychiatric:        Mood and Affect: Mood normal.        Behavior: Behavior normal.         Assessment And Plan:     1. Pure hypercholesterolemia Comments: Chronic, I will check a lipid panel and LFTs today. She will rto in DEc 2023 for her next DM check.  - Lipid panel - ALT  2. Skin lesion of back Comments: I will refer her to Derm as requested.  - Ambulatory referral to Dermatology  3. Class 1 obesity due to excess calories with serious comorbidity and body mass index (BMI) of 33.0 to 33.9 in adult Comments: She was congratulated on her new workout regimen. She is encouraged to keep it up.   4. Immunization due - Pneumococcal conjugate vaccine 20-valent (Prevnar 20)     Patient was given opportunity to ask questions. Patient verbalized understanding of the plan and was able to repeat key elements of the plan. All questions were answered to their satisfaction.   I, [name], MD, have reviewed all documentation for this visit. The documentation on 12/17/21 for the exam, diagnosis, procedures, and orders are all accurate and complete.   IF YOU HAVE BEEN REFERRED TO A SPECIALIST, IT MAY TAKE 1-2 WEEKS TO SCHEDULE/PROCESS THE REFERRAL. IF YOU HAVE NOT HEARD FROM US/SPECIALIST IN TWO WEEKS, PLEASE GIVE [place] A CALL AT [phone] X [phone].   THE PATIENT IS ENCOURAGED TO PRACTICE SOCIAL DISTANCING DUE TO THE COVID-19 PANDEMIC.

## 2021-12-18 LAB — LIPID PANEL
Chol/HDL Ratio: 4 ratio (ref 0.0–4.4)
Cholesterol, Total: 182 mg/dL (ref 100–199)
HDL: 46 mg/dL (ref >39)
LDL Chol Calc (NIH): 114 mg/dL — ABNORMAL HIGH (ref 0–99)
Triglycerides: 123 mg/dL (ref 0–149)
VLDL Cholesterol Cal: 22 mg/dL (ref 5–40)

## 2021-12-18 LAB — ALT: ALT: 17 IU/L (ref 0–32)

## 2021-12-25 ENCOUNTER — Other Ambulatory Visit: Payer: Self-pay | Admitting: Internal Medicine

## 2021-12-25 DIAGNOSIS — Z1231 Encounter for screening mammogram for malignant neoplasm of breast: Secondary | ICD-10-CM

## 2022-01-02 ENCOUNTER — Ambulatory Visit
Admission: RE | Admit: 2022-01-02 | Discharge: 2022-01-02 | Disposition: A | Discharge status: Home / Self Care | Payer: Medicare Other | Source: Ambulatory Visit | Attending: Internal Medicine | Admitting: Internal Medicine

## 2022-01-02 DIAGNOSIS — Z1231 Encounter for screening mammogram for malignant neoplasm of breast: Secondary | ICD-10-CM

## 2022-01-27 ENCOUNTER — Other Ambulatory Visit: Payer: Medicare Other

## 2022-01-27 DIAGNOSIS — H938X3 Other specified disorders of ear, bilateral: Secondary | ICD-10-CM

## 2022-01-28 LAB — NOVEL CORONAVIRUS, NAA: SARS-CoV-2, NAA: NOT DETECTED

## 2022-01-29 ENCOUNTER — Encounter: Payer: Self-pay | Admitting: Internal Medicine

## 2022-01-29 ENCOUNTER — Ambulatory Visit (INDEPENDENT_AMBULATORY_CARE_PROVIDER_SITE_OTHER): Payer: Medicare Other | Admitting: Internal Medicine

## 2022-01-29 VITALS — BP 134/72 | HR 65 | Temp 98.3°F | Ht 63.0 in | Wt 191.2 lb

## 2022-01-29 DIAGNOSIS — E1165 Type 2 diabetes mellitus with hyperglycemia: Secondary | ICD-10-CM | POA: Diagnosis not present

## 2022-01-29 DIAGNOSIS — Z6833 Body mass index (BMI) 33.0-33.9, adult: Secondary | ICD-10-CM

## 2022-01-29 DIAGNOSIS — E6609 Other obesity due to excess calories: Secondary | ICD-10-CM | POA: Diagnosis not present

## 2022-01-29 DIAGNOSIS — I10 Essential (primary) hypertension: Secondary | ICD-10-CM | POA: Diagnosis not present

## 2022-01-29 DIAGNOSIS — J0100 Acute maxillary sinusitis, unspecified: Secondary | ICD-10-CM

## 2022-01-29 MED ORDER — CEFTRIAXONE SODIUM 500 MG IJ SOLR
500.0000 mg | Freq: Once | INTRAMUSCULAR | Status: AC
  Administered 2022-01-29: 500 mg via INTRAMUSCULAR

## 2022-01-29 NOTE — Patient Instructions (Signed)

## 2022-01-29 NOTE — Progress Notes (Signed)
Barnet Glasgow Martin,acting as a Education administrator for [name], MD.,have documented all relevant documentation on the behalf of [name], MD,as directed by  [name], MD while in the presence of [name], MD.    Subjective:     Patient ID: Christina Anthony , female    DOB: 10-Apr-1955 , 66 y.o.   MRN: [id number]   Chief Complaint  Patient presents with   Otitis Media    HPI  Patient presents today for ear congestion and DM check.  She feels congestion and her hearing is diminished. She thinks she may have an hear infection.  Her sx started a week ago. She states her husband has been sick as wwll.  Patient states she can't sleep at night as it worsens.  She has been taking OTC meds without relief of her sx. Patient has taken 2 at home covid test that were negative, and PCR Covid is Negative.   BP Readings from Last 3 Encounters: 01/29/22 : (!) 140/70 12/17/21 : 124/80 12/11/21 : 120/80    Ear Fullness  There is pain in the left ear. This is a recurrent problem. The current episode started in the past 7 days. The problem occurs constantly. The problem has been unchanged. There has been no fever. The pain is at a severity of 3/10. The pain is mild. Associated symptoms include hearing loss.     Past Medical History:  Diagnosis Date   Chronic uveitis of both eyes    Diabetes mellitus without complication (HCC)    GERD (gastroesophageal reflux disease)    no meds currently   Glaucoma (increased eye pressure)    Goiter    Hypertension      Family History  Problem Relation Age of Onset   Diabetes Mother    Hypertension Mother    Glaucoma Mother    Hypertension Father    Cancer Sister      Current Outpatient Medications:    brimonidine (ALPHAGAN) 0.2 % ophthalmic solution, 1 drop 3 (three) times daily. 1 drop tid right eye, bid left eye, Disp: , Rfl:    cetirizine (ZYRTEC) 10 MG tablet, Take 10 mg by mouth daily., Disp: , Rfl:    Cholecalciferol (VITAMIN  D3) 25 MCG (1000 UT) CAPS, Take by mouth., Disp: , Rfl:    dorzolamide-timolol (COSOPT) 22.3-6.8 MG/ML ophthalmic solution, Place 1 drop into both eyes 2 (two) times daily., Disp: , Rfl:    Folic Acid 5 MG CAPS, daily. , Disp: , Rfl:    methotrexate (RHEUMATREX) 2.5 MG tablet, 2.5 mg. 8 tabs weekly, Disp: , Rfl:    Multiple Vitamin (THERA) TABS, Take by mouth., Disp: , Rfl:    Multiple Vitamins-Minerals (CENTRUM SILVER 50+WOMEN) TABS, Take by mouth., Disp: , Rfl:    Omega-3 Fatty Acids (FISH OIL PO), Take by mouth., Disp: , Rfl:    omeprazole (PRILOSEC) 20 MG capsule, Take by mouth., Disp: , Rfl:    pravastatin (PRAVACHOL) 80 MG tablet, Take 1 tablet (80 mg total) by mouth daily., Disp: 90 tablet, Rfl: 1   triamterene-hydrochlorothiazide (MAXZIDE-25) 37.5-25 MG tablet, TAKE 1 TABLET BY MOUTH EVERY DAY, Disp: 90 tablet, Rfl: 2   TURMERIC PO, Take 1 capsule by mouth daily., Disp: , Rfl:    Dapagliflozin-metFORMIN HCl ER (XIGDUO XR) 5-500 MG TB24, Take by mouth. (Patient not taking: Reported on 10/01/2021), Disp: , Rfl:    No Known Allergies   Review of Systems  Constitutional: Negative.   HENT:  Positive for hearing loss.   Eyes: Negative.   Respiratory: Negative.    Cardiovascular: Negative.   Gastrointestinal: Negative.      Today's Vitals   01/29/22 1505 01/29/22 1539  BP: (!) 140/70 134/72  Pulse: 65   Temp: 98.3 F (36.8 C)   TempSrc: Oral   SpO2: 98%   Weight: 191 lb 3.2 oz (86.7 kg)   Height: _0  (1.6 m)   PainSc: 0-No pain    Body mass index is 33.87 kg/m.  Wt Readings from Last 3 Encounters:  01/29/22 191 lb 3.2 oz (86.7 kg)  12/17/21 188 lb 6.4 oz (85.5 kg)  12/11/21 189 lb (85.7 kg)    Objective:  Physical Exam Vitals and nursing note reviewed.  Constitutional:      Appearance: Normal appearance.  HENT:     Head: Normocephalic and atraumatic.     Comments: Maxillary tenderness to percussion b/l    Right Ear: Tympanic membrane, ear canal and external ear  normal. There is no impacted cerumen.     Left Ear: Tympanic membrane, ear canal and external ear normal. There is no impacted cerumen.     Nose:     Comments: Masked     Mouth/Throat:     Comments: Masked  Cardiovascular:     Rate and Rhythm: Normal rate and regular rhythm.     Heart sounds: Normal heart sounds.  Pulmonary:     Effort: Pulmonary effort is normal.     Breath sounds: Normal breath sounds.  Skin:    General: Skin is warm.  Neurological:     General: No focal deficit present.     Mental Status: She is alert.  Psychiatric:        Mood and Affect: Mood normal.        Behavior: Behavior normal.      Assessment And Plan:     1. Acute non-recurrent maxillary sinusitis Comments: She was given Rocephin, IM x 1 and I will send rx Augmentin to take twice daily. Advised to avoid dairy for 1-2 weeks as well. - cefTRIAXone (ROCEPHIN) injection 500 mg  2. Uncontrolled type 2 diabetes mellitus with hyperglycemia ([place]) Comments: I will check labs as below. She does not wish to start meds at this time, despite risks discussed associated with uncontrolled DM. - Hemoglobin A1c - BMP8+EGFR  3. Essential hypertension, benign Comments: Chronic, fair control. Due to current illness, I will not change meds. Should not take OTC decongestants as this can also raise her BP.  4. Class 1 obesity due to excess calories with serious comorbidity and body mass index (BMI) of 33.0 to 33.9 in adult Comments: She is encouraged to aim for at least 150 minutes of exercise/week, while striving for BMI<30 to decrease cardiac risk.    Patient was given opportunity to ask questions. Patient verbalized understanding of the plan and was able to repeat key elements of the plan. All questions were answered to their satisfaction.   I, [name], MD, have reviewed all documentation for this visit. The documentation on 02/08/22 for the exam, diagnosis, procedures, and orders are all accurate and  complete.   IF YOU HAVE BEEN REFERRED TO A SPECIALIST, IT MAY TAKE 1-2 WEEKS TO SCHEDULE/PROCESS THE REFERRAL. IF YOU HAVE NOT HEARD FROM US/SPECIALIST IN TWO WEEKS, PLEASE GIVE [place] A CALL AT [phone] X [phone].   THE PATIENT IS ENCOURAGED TO PRACTICE SOCIAL DISTANCING DUE TO THE COVID-19 PANDEMIC.

## 2022-01-30 LAB — BMP8+EGFR
BUN/Creatinine Ratio: 16 (ref 12–28)
BUN: 12 mg/dL (ref 8–27)
CO2: 26 mmol/L (ref 20–29)
Calcium: 9.9 mg/dL (ref 8.7–10.3)
Chloride: 103 mmol/L (ref 96–106)
Creatinine, Ser: 0.76 mg/dL (ref 0.57–1.00)
Glucose: 82 mg/dL (ref 70–99)
Potassium: 3.8 mmol/L (ref 3.5–5.2)
Sodium: 142 mmol/L (ref 134–144)
eGFR: 87 mL/min/{1.73_m2} (ref >59)

## 2022-01-30 LAB — HEMOGLOBIN A1C
Est. average glucose Bld gHb Est-mCnc: 140 mg/dL
Hgb A1c MFr Bld: 6.5 % — ABNORMAL HIGH (ref 4.8–5.6)

## 2022-02-04 ENCOUNTER — Ambulatory Visit: Payer: BC Managed Care – PPO | Admitting: Internal Medicine

## 2022-02-19 ENCOUNTER — Other Ambulatory Visit: Payer: Self-pay

## 2022-02-19 ENCOUNTER — Ambulatory Visit: Payer: Medicare Other

## 2022-02-19 MED ORDER — ZOSTER VAC RECOMB ADJUVANTED 50 MCG/0.5ML IM SUSR: 0.5000 mL | Freq: Once | INTRAMUSCULAR | 0 refills | Status: AC

## 2022-02-19 NOTE — Progress Notes (Deleted)
Pt presents today for second shingles.

## 2022-03-06 DIAGNOSIS — L821 Other seborrheic keratosis: Secondary | ICD-10-CM | POA: Diagnosis not present

## 2022-03-06 DIAGNOSIS — D239 Other benign neoplasm of skin, unspecified: Secondary | ICD-10-CM | POA: Diagnosis not present

## 2022-04-08 ENCOUNTER — Other Ambulatory Visit: Payer: Self-pay | Admitting: Internal Medicine

## 2022-05-07 ENCOUNTER — Ambulatory Visit (INDEPENDENT_AMBULATORY_CARE_PROVIDER_SITE_OTHER): Payer: Medicare Other | Admitting: Internal Medicine

## 2022-05-07 ENCOUNTER — Encounter: Payer: Self-pay | Admitting: Internal Medicine

## 2022-05-07 VITALS — BP 130/82 | HR 78 | Temp 97.9°F | Ht 63.0 in | Wt 186.8 lb

## 2022-05-07 DIAGNOSIS — Z6833 Body mass index (BMI) 33.0-33.9, adult: Secondary | ICD-10-CM

## 2022-05-07 DIAGNOSIS — E1165 Type 2 diabetes mellitus with hyperglycemia: Secondary | ICD-10-CM

## 2022-05-07 DIAGNOSIS — E7800 Pure hypercholesterolemia, unspecified: Secondary | ICD-10-CM | POA: Diagnosis not present

## 2022-05-07 DIAGNOSIS — L821 Other seborrheic keratosis: Secondary | ICD-10-CM

## 2022-05-07 DIAGNOSIS — L659 Nonscarring hair loss, unspecified: Secondary | ICD-10-CM

## 2022-05-07 DIAGNOSIS — I10 Essential (primary) hypertension: Secondary | ICD-10-CM

## 2022-05-07 DIAGNOSIS — E6609 Other obesity due to excess calories: Secondary | ICD-10-CM

## 2022-05-07 LAB — POCT URINALYSIS DIPSTICK
Bilirubin, UA: NEGATIVE
Glucose, UA: NEGATIVE
Ketones, UA: NEGATIVE
Leukocytes, UA: NEGATIVE
Nitrite, UA: NEGATIVE
Protein, UA: NEGATIVE
Spec Grav, UA: 1.02 (ref 1.010–1.025)
Urobilinogen, UA: 1 E.U./dL
pH, UA: 7.5 (ref 5.0–8.0)

## 2022-05-07 NOTE — Progress Notes (Signed)
I,Victoria T Hamilton,acting as a scribe for [name], MD.,have documented all relevant documentation on the behalf of [name], MD,as directed by  [name], MD while in the presence of [name], MD.    Subjective:     Patient ID: Christina Anthony , female    DOB: 1955/05/26 , 67 y.o.   MRN: [id number]   Chief Complaint  Patient presents with   Diabetes   Hypertension    HPI  Patient presents today for diabetes and blood pressure follow up. She denies headache, chest pain, SOB, blurred vision. She reports she has started walking 3 times a week, MWF. Her and a classmate walk about 3 miles each time.   She is scheduled next week for colonoscopy consultation.     Diabetes She presents for her follow-up diabetic visit. She has type 2 diabetes mellitus. Her disease course has been stable. There are no hypoglycemic associated symptoms. Pertinent negatives for diabetes include no blurred vision and no chest pain. There are no hypoglycemic complications. Risk factors for coronary artery disease include diabetes mellitus, dyslipidemia, hypertension and post-menopausal. When asked about current treatments, none were reported. She participates in exercise three times a week. An ACE inhibitor/angiotensin II receptor blocker is being taken.  Hypertension This is a chronic problem. The current episode started more than 1 year ago. The problem has been gradually improving since onset. The problem is controlled. Pertinent negatives include no blurred vision, chest pain, palpitations or shortness of breath. Risk factors for coronary artery disease include post-menopausal state and sedentary lifestyle. Past treatments include diuretics. The current treatment provides moderate improvement. Compliance problems include exercise.   Hyperlipidemia This is a chronic problem. The current episode started more than 1 year ago. The problem is controlled. Exacerbating diseases include  diabetes and obesity. Pertinent negatives include no chest pain or shortness of breath. Current antihyperlipidemic treatment includes statins. The current treatment provides moderate improvement of lipids.     Past Medical History:  Diagnosis Date   Chronic uveitis of both eyes    Diabetes mellitus without complication (HCC)    GERD (gastroesophageal reflux disease)    no meds currently   Glaucoma (increased eye pressure)    Goiter    Hypertension      Family History  Problem Relation Age of Onset   Diabetes Mother    Hypertension Mother    Glaucoma Mother    Hypertension Father    Cancer Sister      Current Outpatient Medications:    brimonidine (ALPHAGAN) 0.2 % ophthalmic solution, 1 drop 3 (three) times daily. 1 drop tid right eye, bid left eye, Disp: , Rfl:    cetirizine (ZYRTEC) 10 MG tablet, Take 10 mg by mouth daily., Disp: , Rfl:    Cholecalciferol (VITAMIN D3) 25 MCG (1000 UT) CAPS, Take by mouth., Disp: , Rfl:    dorzolamide-timolol (COSOPT) 22.3-6.8 MG/ML ophthalmic solution, Place 1 drop into both eyes 2 (two) times daily., Disp: , Rfl:    Folic Acid 5 MG CAPS, daily. , Disp: , Rfl:    methotrexate (RHEUMATREX) 2.5 MG tablet, 2.5 mg. 8 tabs weekly, Disp: , Rfl:    Multiple Vitamins-Minerals (CENTRUM SILVER 50+WOMEN) TABS, Take by mouth., Disp: , Rfl:    Omega-3 Fatty Acids (FISH OIL PO), Take by mouth., Disp: , Rfl:    omeprazole (PRILOSEC) 20 MG capsule, Take by mouth., Disp: , Rfl:    pravastatin (PRAVACHOL) 80 MG tablet, TAKE 1 TABLET  BY MOUTH EVERY DAY, Disp: 90 tablet, Rfl: 1   triamterene-hydrochlorothiazide (MAXZIDE-25) 37.5-25 MG tablet, TAKE 1 TABLET BY MOUTH EVERY DAY, Disp: 90 tablet, Rfl: 2   TURMERIC PO, Take 1 capsule by mouth daily., Disp: , Rfl:    No Known Allergies   Review of Systems  Constitutional: Negative.   Eyes:  Negative for blurred vision.  Respiratory: Negative.  Negative for shortness of breath.   Cardiovascular: Negative.  Negative  for chest pain and palpitations.  Gastrointestinal: Negative.   Musculoskeletal: Negative.   Skin: Negative.        She reports having some hair loss.  Not sure what is triggering her sx. Previously seen by Derm, but she has since left the practice.   Neurological: Negative.   Psychiatric/Behavioral: Negative.       Today's Vitals   05/07/22 1105  BP: 130/82  Pulse: 78  Temp: 97.9 F (36.6 C)  SpO2: 98%  Weight: 186 lb 12.8 oz (84.7 kg)  Height: '5\' 3"'$  (1.6 m)   Body mass index is 33.09 kg/m.  Wt Readings from Last 3 Encounters:  05/07/22 186 lb 12.8 oz (84.7 kg)  01/29/22 191 lb 3.2 oz (86.7 kg)  12/17/21 188 lb 6.4 oz (85.5 kg)    Objective:  Physical Exam Vitals and nursing note reviewed.  Constitutional:      Appearance: Normal appearance.  HENT:     Head: Normocephalic and atraumatic.     Nose:     Comments: Masked     Mouth/Throat:     Comments: Masked  Eyes:     Extraocular Movements: Extraocular movements intact.  Cardiovascular:     Rate and Rhythm: Normal rate and regular rhythm.     Heart sounds: Normal heart sounds.  Pulmonary:     Effort: Pulmonary effort is normal.     Breath sounds: Normal breath sounds.  Musculoskeletal:     Cervical back: Normal range of motion.  Skin:    General: Skin is warm.     Comments: Scattered, hyperpigmented stuck on plaques.   Neurological:     General: No focal deficit present.     Mental Status: She is alert.  Psychiatric:        Mood and Affect: Mood normal.        Behavior: Behavior normal.     Assessment And Plan:     1. Uncontrolled type 2 diabetes mellitus with hyperglycemia (HCC) Comments: Chronic, not yet on meds. We discussed use of SGLT2-inhibitors to control DM. I will check renal funciton today. - POCT Urinalysis Dipstick (81002) - Microalbumin / creatinine urine ratio - CMP14+EGFR - Hemoglobin A1c  2. Essential hypertension, benign Comments: Chronic, controlled. She will c/w Maxzide daily. I  will consider adding ARB in the future. - POCT Urinalysis Dipstick (81002) - Microalbumin / creatinine urine ratio - CMP14+EGFR  3. Pure hypercholesterolemia Comments: Chronic, LDL goal < 70. She will c/w pravastatin daily. She is encouraged to follow heart healthy diet.  4. Hair loss Comments: I will refer her to local Derm for further evaluation. - Iron, TIBC and Ferritin Panel - TSH - Ambulatory referral to Dermatology  5. Seborrheic keratoses Comments: Some appear to be irritated, she will consult with Derm for possible removal. - Ambulatory referral to Dermatology  6. Class 1 obesity due to excess calories with serious comorbidity and body mass index (BMI) of 33.0 to 33.9 in adult Comments: She is encouraged to strive for BMI less than 30 to decrease  cardiac risk. Advised to aim for at least 150 minutes of exercise per week.  Patient was given opportunity to ask questions. Patient verbalized understanding of the plan and was able to repeat key elements of the plan. All questions were answered to their satisfaction.   I, [name], MD, have reviewed all documentation for this visit. The documentation on 05/10/22 for the exam, diagnosis, procedures, and orders are all accurate and complete.   IF YOU HAVE BEEN REFERRED TO A SPECIALIST, IT MAY TAKE 1-2 WEEKS TO SCHEDULE/PROCESS THE REFERRAL. IF YOU HAVE NOT HEARD FROM US/SPECIALIST IN TWO WEEKS, PLEASE GIVE [place] A CALL AT [phone].   THE PATIENT IS ENCOURAGED TO PRACTICE SOCIAL DISTANCING DUE TO THE COVID-19 PANDEMIC.

## 2022-05-07 NOTE — Patient Instructions (Signed)

## 2022-05-08 LAB — CMP14+EGFR
ALT: 19 IU/L (ref 0–32)
AST: 18 IU/L (ref 0–40)
Albumin/Globulin Ratio: 2 (ref 1.2–2.2)
Albumin: 4.8 g/dL (ref 3.9–4.9)
Alkaline Phosphatase: 88 IU/L (ref 44–121)
BUN/Creatinine Ratio: 15 (ref 12–28)
BUN: 15 mg/dL (ref 8–27)
Bilirubin Total: 0.3 mg/dL (ref 0.0–1.2)
CO2: 26 mmol/L (ref 20–29)
Calcium: 10.6 mg/dL — ABNORMAL HIGH (ref 8.7–10.3)
Chloride: 102 mmol/L (ref 96–106)
Creatinine, Ser: 0.98 mg/dL (ref 0.57–1.00)
Globulin, Total: 2.4 g/dL (ref 1.5–4.5)
Glucose: 95 mg/dL (ref 70–99)
Potassium: 4.5 mmol/L (ref 3.5–5.2)
Sodium: 142 mmol/L (ref 134–144)
Total Protein: 7.2 g/dL (ref 6.0–8.5)
eGFR: 64 mL/min/{1.73_m2} (ref >59)

## 2022-05-08 LAB — TSH: TSH: 0.563 u[IU]/mL (ref 0.450–4.500)

## 2022-05-08 LAB — IRON,TIBC AND FERRITIN PANEL
Ferritin: 344 ng/mL — ABNORMAL HIGH (ref 15–150)
Iron Saturation: 22 % (ref 15–55)
Iron: 67 ug/dL (ref 27–139)
Total Iron Binding Capacity: 302 ug/dL (ref 250–450)
UIBC: 235 ug/dL (ref 118–369)

## 2022-05-08 LAB — HEMOGLOBIN A1C
Est. average glucose Bld gHb Est-mCnc: 146 mg/dL
Hgb A1c MFr Bld: 6.7 % — ABNORMAL HIGH (ref 4.8–5.6)

## 2022-05-09 ENCOUNTER — Other Ambulatory Visit: Payer: Self-pay | Admitting: Internal Medicine

## 2022-05-09 DIAGNOSIS — R3129 Other microscopic hematuria: Secondary | ICD-10-CM

## 2022-05-09 DIAGNOSIS — N281 Cyst of kidney, acquired: Secondary | ICD-10-CM

## 2022-05-09 LAB — MICROALBUMIN / CREATININE URINE RATIO
Creatinine, Urine: 87.2 mg/dL
Microalb/Creat Ratio: 10 mg/g creat (ref 0–29)
Microalbumin, Urine: 8.3 ug/mL

## 2022-05-12 DIAGNOSIS — K219 Gastro-esophageal reflux disease without esophagitis: Secondary | ICD-10-CM | POA: Diagnosis not present

## 2022-05-12 DIAGNOSIS — Z1211 Encounter for screening for malignant neoplasm of colon: Secondary | ICD-10-CM | POA: Diagnosis not present

## 2022-05-12 DIAGNOSIS — Z86010 Personal history of colonic polyps: Secondary | ICD-10-CM | POA: Diagnosis not present

## 2022-05-12 DIAGNOSIS — K5730 Diverticulosis of large intestine without perforation or abscess without bleeding: Secondary | ICD-10-CM | POA: Diagnosis not present

## 2022-05-27 ENCOUNTER — Ambulatory Visit
Admission: RE | Admit: 2022-05-27 | Discharge: 2022-05-27 | Disposition: A | Discharge status: Home / Self Care | Payer: Medicare Other | Source: Ambulatory Visit | Attending: Internal Medicine | Admitting: Internal Medicine

## 2022-05-27 DIAGNOSIS — N2889 Other specified disorders of kidney and ureter: Secondary | ICD-10-CM | POA: Diagnosis not present

## 2022-05-27 DIAGNOSIS — R3129 Other microscopic hematuria: Secondary | ICD-10-CM | POA: Diagnosis not present

## 2022-05-27 DIAGNOSIS — N281 Cyst of kidney, acquired: Secondary | ICD-10-CM | POA: Diagnosis not present

## 2022-06-15 DIAGNOSIS — H2013 Chronic iridocyclitis, bilateral: Secondary | ICD-10-CM | POA: Diagnosis not present

## 2022-06-15 DIAGNOSIS — H401113 Primary open-angle glaucoma, right eye, severe stage: Secondary | ICD-10-CM | POA: Diagnosis not present

## 2022-06-15 DIAGNOSIS — Z79899 Other long term (current) drug therapy: Secondary | ICD-10-CM | POA: Diagnosis not present

## 2022-06-15 DIAGNOSIS — H401122 Primary open-angle glaucoma, left eye, moderate stage: Secondary | ICD-10-CM | POA: Diagnosis not present

## 2022-06-16 DIAGNOSIS — H401113 Primary open-angle glaucoma, right eye, severe stage: Secondary | ICD-10-CM | POA: Diagnosis not present

## 2022-06-16 DIAGNOSIS — Z79899 Other long term (current) drug therapy: Secondary | ICD-10-CM | POA: Diagnosis not present

## 2022-06-16 DIAGNOSIS — H35371 Puckering of macula, right eye: Secondary | ICD-10-CM | POA: Diagnosis not present

## 2022-06-16 DIAGNOSIS — Z961 Presence of intraocular lens: Secondary | ICD-10-CM | POA: Diagnosis not present

## 2022-06-16 DIAGNOSIS — H2013 Chronic iridocyclitis, bilateral: Secondary | ICD-10-CM | POA: Diagnosis not present

## 2022-06-16 DIAGNOSIS — H401122 Primary open-angle glaucoma, left eye, moderate stage: Secondary | ICD-10-CM | POA: Diagnosis not present

## 2022-06-16 DIAGNOSIS — H35033 Hypertensive retinopathy, bilateral: Secondary | ICD-10-CM | POA: Diagnosis not present

## 2022-06-25 ENCOUNTER — Ambulatory Visit (INDEPENDENT_AMBULATORY_CARE_PROVIDER_SITE_OTHER): Payer: Medicare Other | Admitting: Internal Medicine

## 2022-06-25 ENCOUNTER — Ambulatory Visit: Payer: Medicare Other

## 2022-06-25 ENCOUNTER — Encounter: Payer: Self-pay | Admitting: Internal Medicine

## 2022-06-25 VITALS — BP 110/60 | HR 90 | Temp 98.4°F | Ht 63.0 in | Wt 184.6 lb

## 2022-06-25 VITALS — BP 110/60 | HR 90 | Temp 98.4°F | Ht 63.0 in | Wt 184.0 lb

## 2022-06-25 DIAGNOSIS — Z79899 Other long term (current) drug therapy: Secondary | ICD-10-CM | POA: Diagnosis not present

## 2022-06-25 DIAGNOSIS — E7800 Pure hypercholesterolemia, unspecified: Secondary | ICD-10-CM

## 2022-06-25 DIAGNOSIS — E1165 Type 2 diabetes mellitus with hyperglycemia: Secondary | ICD-10-CM

## 2022-06-25 DIAGNOSIS — I10 Essential (primary) hypertension: Secondary | ICD-10-CM | POA: Diagnosis not present

## 2022-06-25 DIAGNOSIS — Z6832 Body mass index (BMI) 32.0-32.9, adult: Secondary | ICD-10-CM | POA: Diagnosis not present

## 2022-06-25 DIAGNOSIS — E6609 Other obesity due to excess calories: Secondary | ICD-10-CM | POA: Diagnosis not present

## 2022-06-25 DIAGNOSIS — Z0000 Encounter for general adult medical examination without abnormal findings: Secondary | ICD-10-CM

## 2022-06-25 MED ORDER — DAPAGLIFLOZIN PROPANEDIOL 10 MG PO TABS: 10.0000 mg | ORAL_TABLET | Freq: Every day | ORAL | 3 refills | Status: DC

## 2022-06-25 NOTE — Progress Notes (Signed)
Subjective:   Christina Anthony is a 67 y.o. female who presents for Medicare Annual (Subsequent) preventive examination.  Review of Systems     Cardiac Risk Factors include: advanced age (>46men, >74 women);obesity (BMI >30kg/m2);smoking/ tobacco exposure     Objective:    Today's Vitals   06/25/22 1521  BP: 110/60  Pulse: 90  Temp: 98.4 F (36.9 C)  TempSrc: Oral  SpO2: 97%  Weight: 184 lb 9.6 oz (83.7 kg)  Height:  (1.6 m)   Body mass index is 32.7 kg/m.     06/25/2022    3:31 PM 01/07/2021    9:24 AM 11/28/2010   11:32 AM  Advanced Directives  Does Patient Have a Medical Advance Directive? No No Patient does not have advance directive  Would patient like information on creating a medical advance directive? No - Patient declined No - Patient declined     Current Medications (verified) Outpatient Encounter Medications as of 06/25/2022  Medication Sig   brimonidine (ALPHAGAN) 0.2 % ophthalmic solution 1 drop 3 (three) times daily. 1 drop tid right eye, bid left eye   cetirizine (ZYRTEC) 10 MG tablet Take 10 mg by mouth daily.   Cholecalciferol (VITAMIN D3) 25 MCG (1000 UT) CAPS Take by mouth.   dapagliflozin propanediol (FARXIGA) 10 MG TABS tablet Take 10 mg by mouth daily.   dorzolamide-timolol (COSOPT) 22.3-6.8 MG/ML ophthalmic solution Place 1 drop into both eyes 2 (two) times daily.   Folic Acid 5 MG CAPS daily.    methotrexate (RHEUMATREX) 2.5 MG tablet 2.5 mg. 8 tabs weekly   Multiple Vitamins-Minerals (CENTRUM SILVER 50+WOMEN) TABS Take by mouth.   Omega-3 Fatty Acids (FISH OIL PO) Take by mouth.   omeprazole (PRILOSEC) 20 MG capsule Take by mouth.   pravastatin (PRAVACHOL) 80 MG tablet TAKE 1 TABLET BY MOUTH EVERY DAY   triamterene-hydrochlorothiazide (MAXZIDE-25) 37.5-25 MG tablet TAKE 1 TABLET BY MOUTH EVERY DAY   TURMERIC PO Take 1 capsule by mouth daily.   No facility-administered encounter medications on file as of 06/25/2022.    Allergies  (verified) Patient has no known allergies.   History: Past Medical History:  Diagnosis Date   Chronic uveitis of both eyes    Diabetes mellitus without complication    GERD (gastroesophageal reflux disease)    no meds currently   Glaucoma (increased eye pressure)    Goiter    Hypertension    Past Surgical History:  Procedure Laterality Date   COLONOSCOPY     NO PAST SURGERIES     UPPER GASTROINTESTINAL ENDOSCOPY     Family History  Problem Relation Age of Onset   Diabetes Mother    Hypertension Mother    Glaucoma Mother    Hypertension Father    Cancer Sister    Social History   Socioeconomic History   Marital status: Married    Spouse name: Not on file   Number of children: Not on file   Years of education: Not on file   Highest education level: Not on file  Occupational History   Not on file  Tobacco Use   Smoking status: Every Day    Packs/day: 0.25    Years: 48.00    Additional pack years: 0.00    Total pack years: 12.00    Types: Cigarettes   Smokeless tobacco: Never   Tobacco comments:    decrease number of cigs smoked per day  Vaping Use   Vaping Use: Never used  Substance and  Sexual Activity   Alcohol use: Yes    Alcohol/week: 1.0 standard drink of alcohol    Types: 1 Cans of beer per week    Comment: daily   Drug use: No   Sexual activity: Not on file  Other Topics Concern   Not on file  Social History Narrative   Not on file   Social Determinants of Health   Financial Resource Strain: Low Risk  (06/25/2022)   Overall Financial Resource Strain (CARDIA)    Difficulty of Paying Living Expenses: Not hard at all  Food Insecurity: No Food Insecurity (06/25/2022)   Hunger Vital Sign    Worried About Running Out of Food in the Last Year: Never true    Ran Out of Food in the Last Year: Never true  Transportation Needs: No Transportation Needs (06/25/2022)   PRAPARE - Administrator, Civil Service (Medical): No    Lack of  Transportation (Non-Medical): No  Physical Activity: Sufficiently Active (06/25/2022)   Exercise Vital Sign    Days of Exercise per Week: 3 days    Minutes of Exercise per Session: 60 min  Stress: No Stress Concern Present (06/25/2022)   [employer] of Occupational Health - Occupational Stress Questionnaire    Feeling of Stress : Not at all  Social Connections: Not on file    Tobacco Counseling Ready to quit: Yes Counseling given: Not Answered Tobacco comments: decrease number of cigs smoked per day   Clinical Intake:  Pre-visit preparation completed: Yes  Pain : No/denies pain     Diabetes: No  How often do you need to have someone help you when you read instructions, pamphlets, or other written materials from your doctor or pharmacy?: 1 - Never  Diabetic? no  Interpreter Needed?: No  Information entered by :: NAllen LPN   Activities of Daily Living    06/25/2022    3:32 PM  In your present state of health, do you have any difficulty performing the following activities:  Hearing? 0  Vision? 0  Comment has glaucoma  Difficulty concentrating or making decisions? 0  Walking or climbing stairs? 0  Dressing or bathing? 0  Doing errands, shopping? 0  Preparing Food and eating ? N  Using the Toilet? N  In the past six months, have you accidently leaked urine? N  Do you have problems with loss of bowel control? N  Managing your Medications? N  Managing your Finances? N  Housekeeping or managing your Housekeeping? N    Patient Care Team: Dorothyann Peng, MD as PCP - General (Internal Medicine) Maxie Better, MD as Consulting Physician (Obstetrics and Gynecology)  Indicate any recent Medical Services you may have received from other than [facility] providers in the past year (date may be approximate).     Assessment:   This is a routine wellness examination for Christina Anthony.  Hearing/Vision screen Vision Screening - Comments:: Regular eye exams, [facility]  Dietary issues and exercise activities discussed: Current Exercise Habits: Home exercise routine, Type of exercise: walking, Time (Minutes): 60, Frequency (Times/Week): 3, Weekly Exercise (Minutes/Week): 180   Goals Addressed             This Visit's Progress    Patient Stated       06/25/2022, wants to lose weight       Depression Screen    06/25/2022    3:34 PM 06/25/2022    3:32 PM 05/07/2022   11:07 AM 01/29/2022    3:04  PM 12/17/2021   11:45 AM 01/07/2021    9:24 AM 11/18/2020   10:12 AM  PHQ 2/9 Scores  PHQ - 2 Score 0 0 0 0 0 0 0  PHQ- 9 Score 0          Fall Risk    06/25/2022    3:34 PM 06/25/2022    3:32 PM 05/07/2022   11:07 AM 01/29/2022    3:04 PM 12/17/2021   11:45 AM  Fall Risk   Falls in the past year? 0 0 0 0 0  Number falls in past yr: 0 0 0 0 0  Injury with Fall? 0 0 0 0 0  Risk for fall due to : No Fall Risks Medication side effect No Fall Risks No Fall Risks No Fall Risks  Follow up Falls evaluation completed Falls prevention discussed;Education provided;Falls evaluation completed Falls evaluation completed Falls evaluation completed Falls evaluation completed    FALL RISK PREVENTION PERTAINING TO THE HOME:  Any stairs in or around the home? Yes  If so, are there any without handrails? No  Home free of loose throw rugs in walkways, pet beds, electrical cords, etc? Yes  Adequate lighting in your home to reduce risk of falls? Yes   ASSISTIVE DEVICES UTILIZED TO PREVENT FALLS:  Life alert? No  Use of a cane, walker or w/c? No  Grab bars in the bathroom? No  Shower chair or bench in shower? No  Elevated toilet seat or a handicapped toilet? Yes   TIMED UP AND GO:  Was the test performed? Yes .  Length of time to ambulate 10 feet: 5 sec.   Gait steady and fast without use of assistive device  Cognitive Function:        06/25/2022    3:33 PM  6CIT Screen  What Year? 0 points  What month? 0 points  What time? 0 points  Count back  from 20 0 points  Months in reverse 2 points  Repeat phrase 2 points  Total Score 4 points    Immunizations Immunization History  Administered Date(s) Administered   Fluad Quad(high Dose 65+) 12/11/2021   Influenza Inj Mdck Quad Pf 12/14/2017   Influenza,inj,Quad PF,6+ Mos 11/10/2018, 12/19/2019, 11/18/2020   PFIZER(Purple Top)SARS-COV-2 Vaccination 04/24/2019, 05/15/2019, 12/08/2019   PNEUMOCOCCAL CONJUGATE-20 12/17/2021   Tdap 03/23/2016   Unspecified SARS-COV-2 Vaccination 04/17/2022   Zoster Recombinat (Shingrix) 10/01/2021, 02/19/2022    TDAP status: Up to date  Flu Vaccine status: Up to date  Pneumococcal vaccine status: Up to date  Covid-19 vaccine status: Completed vaccines  Qualifies for Shingles Vaccine? Yes   Zostavax completed Yes   Shingrix Completed?: Yes  Screening Tests Health Maintenance  Topic Date Due   Medicare Annual Wellness (AWV)  Never done   COVID-19 Vaccine (5 - 2023-24 season) 06/12/2022   INFLUENZA VACCINE  10/01/2022   Diabetic kidney evaluation - eGFR measurement  05/07/2023   Diabetic kidney evaluation - Urine ACR  05/07/2023   MAMMOGRAM  01/03/2024   PAP SMEAR-Modifier  08/06/2024   DTaP/Tdap/Td (2 - Td or Tdap) 03/23/2026   COLONOSCOPY (Pts 45-38yrs Insurance coverage will need to be confirmed)  06/05/2027   Pneumonia Vaccine 71+ Years old  Completed   DEXA SCAN  Completed   Hepatitis C Screening  Completed   HIV Screening  Completed   Zoster Vaccines- Shingrix  Completed   HPV VACCINES  Aged Out    Health Maintenance  Health Maintenance Due  Topic Date Due  Medicare Annual Wellness (AWV)  Never done   COVID-19 Vaccine (5 - 2023-24 season) 06/12/2022    Colorectal cancer screening: Type of screening: Colonoscopy. Completed 06/04/2017. Repeat every 5 years  Mammogram status: Completed 01/02/2022. Repeat every year  Bone Density status: Completed 10/21/2017.  Lung Cancer Screening: (Low Dose CT Chest recommended if Age 90-80  years, 30 pack-year currently smoking OR have quit w/in 15years.) does not qualify.   Lung Cancer Screening Referral: no  Additional Screening:  Hepatitis C Screening: does qualify; Completed 06/27/2015  Vision Screening: Recommended annual ophthalmology exams for early detection of glaucoma and other disorders of the eye. Is the patient up to date with their annual eye exam?  Yes  Who is the provider or what is the name of the office in which the patient attends annual eye exams? [facility]  If pt is not established with a provider, would they like to be referred to a provider to establish care? No .   Dental Screening: Recommended annual dental exams for proper oral hygiene  Community Resource Referral / Chronic Care Management: CRR required this visit?  No   CCM required this visit?  No      Plan:     I have personally reviewed and noted the following in the patient's chart:   Medical and social history Use of alcohol, tobacco or illicit drugs  Current medications and supplements including opioid prescriptions. Patient is not currently taking opioid prescriptions. Functional ability and status Nutritional status Physical activity Advanced directives List of other physicians Hospitalizations, surgeries, and ER visits in previous 12 months Vitals Screenings to include cognitive, depression, and falls Referrals and appointments  In addition, I have reviewed and discussed with patient certain preventive protocols, quality metrics, and best practice recommendations. A written personalized care plan for preventive services as well as general preventive health recommendations were provided to patient.     Barb Merino, LPN   11/14/7827   Nurse Notes: none

## 2022-06-25 NOTE — Patient Instructions (Signed)

## 2022-06-25 NOTE — Patient Instructions (Signed)
[name] , Thank you for taking time to come for your Medicare Wellness Visit. I appreciate your ongoing commitment to your health goals. Please review the following plan we discussed and let me know if I can assist you in the future.   These are the goals we discussed:  Goals      Patient Stated     06/25/2022, wants to lose weight        This is a list of the screening recommended for you and due dates:  Health Maintenance  Topic Date Due   COVID-19 Vaccine (5 - 2023-24 season) 06/12/2022   Flu Shot  10/01/2022   Yearly kidney function blood test for diabetes  05/07/2023   Yearly kidney health urinalysis for diabetes  05/07/2023   Medicare Annual Wellness Visit  06/25/2023   Mammogram  01/03/2024   Pap Smear  08/06/2024   DTaP/Tdap/Td vaccine (2 - Td or Tdap) 03/23/2026   Colon Cancer Screening  06/05/2027   Pneumonia Vaccine  Completed   DEXA scan (bone density measurement)  Completed   Hepatitis C Screening: USPSTF Recommendation to screen - Ages 28-79 yo.  Completed   HIV Screening  Completed   Zoster (Shingles) Vaccine  Completed   HPV Vaccine  Aged Out    Advanced directives: Advance directive discussed with you today. Even though you declined this today please call our office should you change your mind and we can give you the proper paperwork for you to fill out.  Conditions/risks identified: smoking  Next appointment: Follow up in one year for your annual wellness visit    Preventive Care 65 Years and Older, Female Preventive care refers to lifestyle choices and visits with your health care provider that can promote health and wellness. What does preventive care include? A yearly physical exam. This is also called an annual well check. Dental exams once or twice a year. Routine eye exams. Ask your health care provider how often you should have your eyes checked. Personal lifestyle choices, including: Daily care of your teeth and gums. Regular physical  activity. Eating a healthy diet. Avoiding tobacco and drug use. Limiting alcohol use. Practicing safe sex. Taking low-dose aspirin every day. Taking vitamin and mineral supplements as recommended by your health care provider. What happens during an annual well check? The services and screenings done by your health care provider during your annual well check will depend on your age, overall health, lifestyle risk factors, and family history of disease. Counseling  Your health care provider may ask you questions about your: Alcohol use. Tobacco use. Drug use. Emotional well-being. Home and relationship well-being. Sexual activity. Eating habits. History of falls. Memory and ability to understand (cognition). Work and work Astronomer. Reproductive health. Screening  You may have the following tests or measurements: Height, weight, and BMI. Blood pressure. Lipid and cholesterol levels. These may be checked every 5 years, or more frequently if you are over 24 years old. Skin check. Lung cancer screening. You may have this screening every year starting at age 78 if you have a 30-pack-year history of smoking and currently smoke or have quit within the past 15 years. Fecal occult blood test (FOBT) of the stool. You may have this test every year starting at age 40. Flexible sigmoidoscopy or colonoscopy. You may have a sigmoidoscopy every 5 years or a colonoscopy every 10 years starting at age 87. Hepatitis C blood test. Hepatitis B blood test. Sexually transmitted disease (STD) testing. Diabetes screening. This is  done by checking your blood sugar (glucose) after you have not eaten for a while (fasting). You may have this done every 1-3 years. Bone density scan. This is done to screen for osteoporosis. You may have this done starting at age 82. Mammogram. This may be done every 1-2 years. Talk to your health care provider about how often you should have regular mammograms. Talk with your  health care provider about your test results, treatment options, and if necessary, the need for more tests. Vaccines  Your health care provider may recommend certain vaccines, such as: Influenza vaccine. This is recommended every year. Tetanus, diphtheria, and acellular pertussis (Tdap, Td) vaccine. You may need a Td booster every 10 years. Zoster vaccine. You may need this after age 38. Pneumococcal 13-valent conjugate (PCV13) vaccine. One dose is recommended after age 29. Pneumococcal polysaccharide (PPSV23) vaccine. One dose is recommended after age 88. Talk to your health care provider about which screenings and vaccines you need and how often you need them. This information is not intended to replace advice given to you by your health care provider. Make sure you discuss any questions you have with your health care provider. Document Released: 03/15/2015 Document Revised: 11/06/2015 Document Reviewed: 12/18/2014 Elsevier Interactive Patient Education  2017 [place] Prevention in the Home Falls can cause injuries. They can happen to people of all ages. There are many things you can do to make your home safe and to help prevent falls. What can I do on the outside of my home? Regularly fix the edges of walkways and driveways and fix any cracks. Remove anything that might make you trip as you walk through a door, such as a raised step or threshold. Trim any bushes or trees on the path to your home. Use bright outdoor lighting. Clear any walking paths of anything that might make someone trip, such as rocks or tools. Regularly check to see if handrails are loose or broken. Make sure that both sides of any steps have handrails. Any raised decks and porches should have guardrails on the edges. Have any leaves, snow, or ice cleared regularly. Use sand or salt on walking paths during winter. Clean up any spills in your garage right away. This includes oil or grease spills. What can I  do in the bathroom? Use night lights. Install grab bars by the toilet and in the tub and shower. Do not use towel bars as grab bars. Use non-skid mats or decals in the tub or shower. If you need to sit down in the shower, use a plastic, non-slip stool. Keep the floor dry. Clean up any water that spills on the floor as soon as it happens. Remove soap buildup in the tub or shower regularly. Attach bath mats securely with double-sided non-slip rug tape. Do not have throw rugs and other things on the floor that can make you trip. What can I do in the bedroom? Use night lights. Make sure that you have a light by your bed that is easy to reach. Do not use any sheets or blankets that are too big for your bed. They should not hang down onto the floor. Have a firm chair that has side arms. You can use this for support while you get dressed. Do not have throw rugs and other things on the floor that can make you trip. What can I do in the kitchen? Clean up any spills right away. Avoid walking on wet floors. Keep items that you  use a lot in easy-to-reach places. If you need to reach something above you, use a strong step stool that has a grab bar. Keep electrical cords out of the way. Do not use floor polish or wax that makes floors slippery. If you must use wax, use non-skid floor wax. Do not have throw rugs and other things on the floor that can make you trip. What can I do with my stairs? Do not leave any items on the stairs. Make sure that there are handrails on both sides of the stairs and use them. Fix handrails that are broken or loose. Make sure that handrails are as long as the stairways. Check any carpeting to make sure that it is firmly attached to the stairs. Fix any carpet that is loose or worn. Avoid having throw rugs at the top or bottom of the stairs. If you do have throw rugs, attach them to the floor with carpet tape. Make sure that you have a light switch at the top of the stairs  and the bottom of the stairs. If you do not have them, ask someone to add them for you. What else can I do to help prevent falls? Wear shoes that: Do not have high heels. Have rubber bottoms. Are comfortable and fit you well. Are closed at the toe. Do not wear sandals. If you use a stepladder: Make sure that it is fully opened. Do not climb a closed stepladder. Make sure that both sides of the stepladder are locked into place. Ask someone to hold it for you, if possible. Clearly mark and make sure that you can see: Any grab bars or handrails. First and last steps. Where the edge of each step is. Use tools that help you move around (mobility aids) if they are needed. These include: Canes. Walkers. Scooters. Crutches. Turn on the lights when you go into a dark area. Replace any light bulbs as soon as they burn out. Set up your furniture so you have a clear path. Avoid moving your furniture around. If any of your floors are uneven, fix them. If there are any pets around you, be aware of where they are. Review your medicines with your doctor. Some medicines can make you feel dizzy. This can increase your chance of falling. Ask your doctor what other things that you can do to help prevent falls. This information is not intended to replace advice given to you by your health care provider. Make sure you discuss any questions you have with your health care provider. Document Released: 12/13/2008 Document Revised: 07/25/2015 Document Reviewed: 03/23/2014 Elsevier Interactive Patient Education  2017 [employer].

## 2022-06-25 NOTE — Progress Notes (Signed)
I,Victoria T Hamilton,acting as a scribe for Gwynneth Aliment, MD.,have documented all relevant documentation on the behalf of Gwynneth Aliment, MD,as directed by  Gwynneth Aliment, MD while in the presence of Gwynneth Aliment, MD.    Subjective:     Patient ID: Christina Anthony , female    DOB: Jan 04, 1956 , 67 y.o.   MRN: [id number]   Chief Complaint  Patient presents with   Diabetes   Hypertension   Hyperlipidemia    HPI  Patient presents today for Farxiga follow up. She reports compliance with medication. She was given 4 weeks worth of samples. She reports taking last pill on this past Monday. She did not have any issues with the medication.    She has also completed AWV with [facility] Advisor, [place] today.    Diabetes She presents for her follow-up diabetic visit. She has type 2 diabetes mellitus. Her disease course has been stable. There are no hypoglycemic associated symptoms. Pertinent negatives for diabetes include no blurred vision. There are no hypoglycemic complications. Risk factors for coronary artery disease include diabetes mellitus, dyslipidemia, hypertension and post-menopausal. When asked about current treatments, none were reported. She participates in exercise three times a week. An ACE inhibitor/angiotensin II receptor blocker is being taken.  Hypertension This is a chronic problem. The current episode started more than 1 year ago. The problem has been gradually improving since onset. The problem is controlled. Pertinent negatives include no blurred vision or palpitations. Risk factors for coronary artery disease include post-menopausal state and sedentary lifestyle. Past treatments include diuretics. The current treatment provides moderate improvement. Compliance problems include exercise.      Past Medical History:  Diagnosis Date   Chronic uveitis of both eyes    Diabetes mellitus without complication (HCC)    GERD (gastroesophageal reflux disease)    no meds  currently   Glaucoma (increased eye pressure)    Goiter    Hypertension      Family History  Problem Relation Age of Onset   Diabetes Mother    Hypertension Mother    Glaucoma Mother    Hypertension Father    Cancer Sister      Current Outpatient Medications:    brimonidine (ALPHAGAN) 0.2 % ophthalmic solution, 1 drop 3 (three) times daily. 1 drop tid right eye, bid left eye, Disp: , Rfl:    cetirizine (ZYRTEC) 10 MG tablet, Take 10 mg by mouth daily., Disp: , Rfl:    Cholecalciferol (VITAMIN D3) 25 MCG (1000 UT) CAPS, Take by mouth., Disp: , Rfl:    dorzolamide-timolol (COSOPT) 22.3-6.8 MG/ML ophthalmic solution, Place 1 drop into both eyes 2 (two) times daily., Disp: , Rfl:    Folic Acid 5 MG CAPS, daily. , Disp: , Rfl:    methotrexate (RHEUMATREX) 2.5 MG tablet, 2.5 mg. 8 tabs weekly, Disp: , Rfl:    Multiple Vitamins-Minerals (CENTRUM SILVER 50+WOMEN) TABS, Take by mouth., Disp: , Rfl:    Omega-3 Fatty Acids (FISH OIL PO), Take by mouth., Disp: , Rfl:    omeprazole (PRILOSEC) 20 MG capsule, Take by mouth., Disp: , Rfl:    pravastatin (PRAVACHOL) 80 MG tablet, TAKE 1 TABLET BY MOUTH EVERY DAY, Disp: 90 tablet, Rfl: 1   triamterene-hydrochlorothiazide (MAXZIDE-25) 37.5-25 MG tablet, TAKE 1 TABLET BY MOUTH EVERY DAY, Disp: 90 tablet, Rfl: 2   TURMERIC PO, Take 1 capsule by mouth daily., Disp: , Rfl:    dapagliflozin propanediol (FARXIGA) 10 MG TABS tablet, Take 1 tablet (  10 mg total) by mouth daily., Disp: 30 tablet, Rfl: 3   No Known Allergies   Review of Systems  Constitutional: Negative.   Eyes:  Negative for blurred vision.  Respiratory: Negative.    Cardiovascular: Negative.  Negative for palpitations.  Gastrointestinal: Negative.   Musculoskeletal: Negative.   Skin: Negative.   Neurological: Negative.   Psychiatric/Behavioral: Negative.       Today's Vitals   06/25/22 1532  BP: 110/60  Pulse: 90  Temp: 98.4 F (36.9 C)  SpO2: 98%  Weight: 184 lb (83.5 kg)   Height: 5\' 3"  (1.6 m)   Body mass index is 32.59 kg/m.  Wt Readings from Last 3 Encounters:  06/25/22 184 lb (83.5 kg)  06/25/22 184 lb 9.6 oz (83.7 kg)  05/07/22 186 lb 12.8 oz (84.7 kg)    BP Readings from Last 3 Encounters:  06/25/22 110/60  06/25/22 110/60  05/07/22 130/82     Objective:  Physical Exam Vitals and nursing note reviewed.  Constitutional:      Appearance: Normal appearance.  HENT:     Head: Normocephalic and atraumatic.  Eyes:     Extraocular Movements: Extraocular movements intact.  Cardiovascular:     Rate and Rhythm: Normal rate and regular rhythm.     Heart sounds: Normal heart sounds.  Pulmonary:     Effort: Pulmonary effort is normal.     Breath sounds: Normal breath sounds.  Musculoskeletal:     Cervical back: Normal range of motion.  Skin:    General: Skin is warm.  Neurological:     General: No focal deficit present.     Mental Status: She is alert.  Psychiatric:        Mood and Affect: Mood normal.        Behavior: Behavior normal.         Assessment And Plan:     1. Uncontrolled type 2 diabetes mellitus with hyperglycemia (HCC) Comments: Chronic, she has done well with [place]. I will check renal function today. She will rto in June 2024 for her next diabetes check.  2. Essential hypertension, benign Comments: She will c/w Maxzide for now, consider changing to ARB given recent diagnosis of diabetes.  3. Class 1 obesity due to excess calories with serious comorbidity and body mass index (BMI) of 32.0 to 32.9 in adult Comments: She is encouraged to strive for BMI less than 30 to decrease cardiac risk. Advised to aim for at least 150 minutes of exercise per week.  4. Drug therapy - BMP8+eGFR  Patient was given opportunity to ask questions. Patient verbalized understanding of the plan and was able to repeat key elements of the plan. All questions were answered to their satisfaction.   I, Gwynneth Aliment, MD, have reviewed all  documentation for this visit. The documentation on 06/27/22 for the exam, diagnosis, procedures, and orders are all accurate and complete.   IF YOU HAVE BEEN REFERRED TO A SPECIALIST, IT MAY TAKE 1-2 WEEKS TO SCHEDULE/PROCESS THE REFERRAL. IF YOU HAVE NOT HEARD FROM US/SPECIALIST IN TWO WEEKS, PLEASE GIVE [place] A CALL AT [phone].   THE PATIENT IS ENCOURAGED TO PRACTICE SOCIAL DISTANCING DUE TO THE COVID-19 PANDEMIC.

## 2022-06-26 LAB — BMP8+EGFR
BUN/Creatinine Ratio: 14 (ref 12–28)
BUN: 16 mg/dL (ref 8–27)
CO2: 23 mmol/L (ref 20–29)
Calcium: 10.2 mg/dL (ref 8.7–10.3)
Chloride: 102 mmol/L (ref 96–106)
Creatinine, Ser: 1.13 mg/dL — ABNORMAL HIGH (ref 0.57–1.00)
Glucose: 75 mg/dL (ref 70–99)
Potassium: 3.4 mmol/L — ABNORMAL LOW (ref 3.5–5.2)
Sodium: 143 mmol/L (ref 134–144)
eGFR: 54 mL/min/{1.73_m2} — ABNORMAL LOW (ref >59)

## 2022-06-29 ENCOUNTER — Other Ambulatory Visit: Payer: Self-pay

## 2022-06-29 MED ORDER — DAPAGLIFLOZIN PROPANEDIOL 10 MG PO TABS: 10.0000 mg | ORAL_TABLET | Freq: Every day | ORAL | 1 refills | Status: DC

## 2022-07-08 DIAGNOSIS — K5730 Diverticulosis of large intestine without perforation or abscess without bleeding: Secondary | ICD-10-CM | POA: Diagnosis not present

## 2022-07-08 DIAGNOSIS — Z86010 Personal history of colonic polyps: Secondary | ICD-10-CM | POA: Diagnosis not present

## 2022-07-08 DIAGNOSIS — Z1211 Encounter for screening for malignant neoplasm of colon: Secondary | ICD-10-CM | POA: Diagnosis not present

## 2022-07-08 DIAGNOSIS — D125 Benign neoplasm of sigmoid colon: Secondary | ICD-10-CM | POA: Diagnosis not present

## 2022-07-08 DIAGNOSIS — K635 Polyp of colon: Secondary | ICD-10-CM | POA: Diagnosis not present

## 2022-07-08 LAB — HM COLONOSCOPY

## 2022-07-28 ENCOUNTER — Encounter: Payer: Self-pay | Admitting: Internal Medicine

## 2022-08-10 DIAGNOSIS — E119 Type 2 diabetes mellitus without complications: Secondary | ICD-10-CM | POA: Diagnosis not present

## 2022-08-11 ENCOUNTER — Encounter: Payer: Self-pay | Admitting: Internal Medicine

## 2022-08-11 ENCOUNTER — Other Ambulatory Visit: Payer: Self-pay | Admitting: Internal Medicine

## 2022-08-11 ENCOUNTER — Ambulatory Visit (INDEPENDENT_AMBULATORY_CARE_PROVIDER_SITE_OTHER): Payer: Medicare Other | Admitting: Internal Medicine

## 2022-08-11 VITALS — BP 122/70 | HR 75 | Temp 98.2°F | Ht 63.0 in | Wt 180.8 lb

## 2022-08-11 DIAGNOSIS — Z6832 Body mass index (BMI) 32.0-32.9, adult: Secondary | ICD-10-CM | POA: Diagnosis not present

## 2022-08-11 DIAGNOSIS — E6609 Other obesity due to excess calories: Secondary | ICD-10-CM | POA: Diagnosis not present

## 2022-08-11 DIAGNOSIS — E1165 Type 2 diabetes mellitus with hyperglycemia: Secondary | ICD-10-CM

## 2022-08-11 DIAGNOSIS — I10 Essential (primary) hypertension: Secondary | ICD-10-CM

## 2022-08-11 DIAGNOSIS — Z7984 Long term (current) use of oral hypoglycemic drugs: Secondary | ICD-10-CM

## 2022-08-11 MED ORDER — CONTOUR BLOOD GLUCOSE SYSTEM W/DEVICE KIT
PACK | 0 refills | Status: AC
Start: 2022-08-11 — End: ?

## 2022-08-11 NOTE — Progress Notes (Signed)
Christina Anthony,acting as a Neurosurgeon for Christina Aliment, MD.,have documented all relevant documentation on the behalf of Christina Aliment, MD,as directed by  Christina Aliment, MD while in the presence of Christina Aliment, MD.   Subjective:  Patient ID: Christina Anthony , female    DOB: Feb 23, 1956 , 67 y.o.   MRN: [id number]  Chief Complaint  Patient presents with   Diabetes    HPI  Patient presents today for diabetes and BP check. She has been on [place] without any issues.  She reports compliance with medication. She did not have any issues with the medication.   BP Readings from Last 3 Encounters: 08/11/22 : 122/70 06/25/22 : 110/60 06/25/22 : 110/60      Diabetes She presents for her follow-up diabetic visit. She has type 2 diabetes mellitus. Her disease course has been stable. There are no hypoglycemic associated symptoms. Pertinent negatives for diabetes include no blurred vision and no chest pain. There are no hypoglycemic complications. Risk factors for coronary artery disease include diabetes mellitus, dyslipidemia, hypertension and post-menopausal. When asked about current treatments, none were reported. She participates in exercise three times a week. An ACE inhibitor/angiotensin II receptor blocker is being taken.  Hypertension This is a chronic problem. The current episode started more than 1 year ago. The problem has been gradually improving since onset. The problem is controlled. Pertinent negatives include no blurred vision, chest pain, palpitations or shortness of breath. Risk factors for coronary artery disease include post-menopausal state and sedentary lifestyle. Past treatments include diuretics. The current treatment provides moderate improvement. Compliance problems include exercise.      Past Medical History:  Diagnosis Date   Chronic uveitis of both eyes    Diabetes mellitus without complication (HCC)    GERD (gastroesophageal reflux disease)    no meds  currently   Glaucoma (increased eye pressure)    Goiter    Hypertension      Family History  Problem Relation Age of Onset   Diabetes Mother    Hypertension Mother    Glaucoma Mother    Hypertension Father    Cancer Sister      Current Outpatient Medications:    Blood Glucose Monitoring Suppl (CONTOUR BLOOD GLUCOSE SYSTEM) w/Device KIT, USE ONCE DAILY TO CHECK BLOOD SUGARS., Disp: 1 kit, Rfl: 0   Blood Glucose Monitoring Suppl DEVI, May substitute to any manufacturer covered by patient's insurance., Disp: 1 each, Rfl: 0   brimonidine (ALPHAGAN) 0.2 % ophthalmic solution, 1 drop 3 (three) times daily. 1 drop tid right eye, bid left eye, Disp: , Rfl:    cetirizine (ZYRTEC) 10 MG tablet, Take 10 mg by mouth daily., Disp: , Rfl:    Cholecalciferol (VITAMIN D3) 25 MCG (1000 UT) CAPS, Take by mouth., Disp: , Rfl:    dapagliflozin propanediol (FARXIGA) 10 MG TABS tablet, Take 1 tablet (10 mg total) by mouth daily., Disp: 90 tablet, Rfl: 1   dorzolamide-timolol (COSOPT) 22.3-6.8 MG/ML ophthalmic solution, Place 1 drop into both eyes 2 (two) times daily., Disp: , Rfl:    Folic Acid 5 MG CAPS, daily. , Disp: , Rfl:    Glucose Blood (BLOOD GLUCOSE TEST STRIPS) STRP, 1 each by In Vitro route in the morning, at noon, and at bedtime. May substitute to any manufacturer covered by patient's insurance., Disp: 100 strip, Rfl: 0   Lancet Device MISC, 1 each by Does not apply route in the morning, at noon, and at bedtime. May substitute  to any manufacturer covered by patient's insurance., Disp: 1 each, Rfl: 0   Lancets Misc. MISC, 1 each by Does not apply route in the morning, at noon, and at bedtime. May substitute to any manufacturer covered by patient's insurance., Disp: 100 each, Rfl: 0   methotrexate (RHEUMATREX) 2.5 MG tablet, 2.5 mg. 8 tabs weekly, Disp: , Rfl:    Multiple Vitamins-Minerals (CENTRUM SILVER 50+WOMEN) TABS, Take by mouth., Disp: , Rfl:    Omega-3 Fatty Acids (FISH OIL PO), Take by  mouth., Disp: , Rfl:    omeprazole (PRILOSEC) 20 MG capsule, Take by mouth., Disp: , Rfl:    pravastatin (PRAVACHOL) 80 MG tablet, TAKE 1 TABLET BY MOUTH EVERY DAY, Disp: 90 tablet, Rfl: 1   triamterene-hydrochlorothiazide (MAXZIDE-25) 37.5-25 MG tablet, TAKE 1 TABLET BY MOUTH EVERY DAY, Disp: 90 tablet, Rfl: 2   TURMERIC PO, Take 1 capsule by mouth daily., Disp: , Rfl:    No Known Allergies   Review of Systems  Constitutional: Negative.   Eyes:  Negative for blurred vision.  Respiratory: Negative.  Negative for shortness of breath.   Cardiovascular: Negative.  Negative for chest pain and palpitations.  Gastrointestinal: Negative.   Musculoskeletal: Negative.   Skin: Negative.   Neurological: Negative.   Psychiatric/Behavioral: Negative.       Today's Vitals   08/11/22 1449  BP: 122/70  Pulse: 75  Temp: 98.2 F (36.8 C)  TempSrc: Oral  Weight: 180 lb 12.8 oz (82 kg)  Height: 5\' 3"  (1.6 m)  PainSc: 0-No pain   Body mass index is 32.03 kg/m.  Wt Readings from Last 3 Encounters:  08/11/22 180 lb 12.8 oz (82 kg)  06/25/22 184 lb (83.5 kg)  06/25/22 184 lb 9.6 oz (83.7 kg)    The 10-year ASCVD risk score (Arnett DK, et al., 2019) is: 32.3%   Values used to calculate the score:     Age: 56 years     Sex: Female     Is Non-Hispanic African American: Yes     Diabetic: Yes     Tobacco smoker: Yes     Systolic Blood Pressure: 122 mmHg     Is BP treated: Yes     HDL Cholesterol: 46 mg/dL     Total Cholesterol: 182 mg/dL  Objective:  Physical Exam Vitals and nursing note reviewed.  Constitutional:      Appearance: Normal appearance.  HENT:     Head: Normocephalic and atraumatic.  Eyes:     Extraocular Movements: Extraocular movements intact.  Cardiovascular:     Rate and Rhythm: Normal rate and regular rhythm.     Heart sounds: Normal heart sounds.  Pulmonary:     Effort: Pulmonary effort is normal.     Breath sounds: Normal breath sounds.  Musculoskeletal:      Cervical back: Normal range of motion.  Skin:    General: Skin is warm.  Neurological:     General: No focal deficit present.     Mental Status: She is alert.  Psychiatric:        Mood and Affect: Mood normal.        Behavior: Behavior normal.         Assessment And Plan:  1. Uncontrolled type 2 diabetes mellitus with hyperglycemia (HCC) Comments: Chronic, recently started on Farxiga 10mg  daily. Encouraged to follow dietary recommendations.  will send glucometer and supplies to her pharmacy.  She will follow up in 3 months.  - Basic metabolic panel - Hemoglobin  A1c - Blood Glucose Monitoring Suppl (CONTOUR BLOOD GLUCOSE SYSTEM) w/Device KIT; USE ONCE DAILY TO CHECK BLOOD SUGARS.  Dispense: 1 kit; Refill: 0  2. Essential hypertension, benign Comments: Chronic, controlled. For now, she will c/w generic Maxzide.  I plan to change to ARB in the near future for added renal protection.  3. Class 1 obesity due to excess calories with serious comorbidity and body mass index (BMI) of 32.0 to 32.9 in adult  She is encouraged to strive for BMI less than 30 to decrease cardiac risk. Advised to aim for at least 150 minutes of exercise per week.   Return for move Physical from Aug to September please.  Patient was given opportunity to ask questions. Patient verbalized understanding of the plan and was able to repeat key elements of the plan. All questions were answered to their satisfaction.   I, Christina Aliment, MD, have reviewed all documentation for this visit. The documentation on 08/11/22 for the exam, diagnosis, procedures, and orders are all accurate and complete.   IF YOU HAVE BEEN REFERRED TO A SPECIALIST, IT MAY TAKE 1-2 WEEKS TO SCHEDULE/PROCESS THE REFERRAL. IF YOU HAVE NOT HEARD FROM US/SPECIALIST IN TWO WEEKS, PLEASE GIVE [place] A CALL AT [phone].

## 2022-08-11 NOTE — Patient Instructions (Signed)

## 2022-08-12 ENCOUNTER — Other Ambulatory Visit: Payer: Self-pay

## 2022-08-12 DIAGNOSIS — E1165 Type 2 diabetes mellitus with hyperglycemia: Secondary | ICD-10-CM

## 2022-08-12 LAB — BASIC METABOLIC PANEL
BUN/Creatinine Ratio: 17 (ref 12–28)
BUN: 16 mg/dL (ref 8–27)
CO2: 26 mmol/L (ref 20–29)
Calcium: 10.1 mg/dL (ref 8.7–10.3)
Chloride: 102 mmol/L (ref 96–106)
Creatinine, Ser: 0.94 mg/dL (ref 0.57–1.00)
Glucose: 99 mg/dL (ref 70–99)
Potassium: 3.6 mmol/L (ref 3.5–5.2)
Sodium: 143 mmol/L (ref 134–144)
eGFR: 67 mL/min/{1.73_m2} (ref >59)

## 2022-08-12 LAB — HEMOGLOBIN A1C
Est. average glucose Bld gHb Est-mCnc: 137 mg/dL
Hgb A1c MFr Bld: 6.4 % — ABNORMAL HIGH (ref 4.8–5.6)

## 2022-08-12 MED ORDER — BLOOD GLUCOSE TEST VI STRP
1.0000 | ORAL_STRIP | Freq: Three times a day (TID) | 0 refills | Status: DC
Start: 2022-08-12 — End: 2022-09-21

## 2022-08-12 MED ORDER — LANCET DEVICE MISC
1.0000 | Freq: Three times a day (TID) | 0 refills | Status: AC
Start: 2022-08-12 — End: 2022-09-11

## 2022-08-12 MED ORDER — LANCETS MISC. MISC
1.0000 | Freq: Three times a day (TID) | 0 refills | Status: AC
Start: 2022-08-12 — End: 2022-09-11

## 2022-08-12 MED ORDER — BLOOD GLUCOSE MONITORING SUPPL DEVI
0 refills | Status: AC
Start: 2022-08-12 — End: ?

## 2022-08-17 DIAGNOSIS — H401113 Primary open-angle glaucoma, right eye, severe stage: Secondary | ICD-10-CM | POA: Diagnosis not present

## 2022-08-17 DIAGNOSIS — H401122 Primary open-angle glaucoma, left eye, moderate stage: Secondary | ICD-10-CM | POA: Diagnosis not present

## 2022-09-17 ENCOUNTER — Encounter: Payer: Self-pay | Admitting: Dermatology

## 2022-09-17 ENCOUNTER — Ambulatory Visit (INDEPENDENT_AMBULATORY_CARE_PROVIDER_SITE_OTHER): Payer: Medicare Other | Admitting: Dermatology

## 2022-09-17 VITALS — BP 119/80 | HR 67

## 2022-09-17 DIAGNOSIS — E669 Obesity, unspecified: Secondary | ICD-10-CM | POA: Insufficient documentation

## 2022-09-17 DIAGNOSIS — Z860100 Personal history of colon polyps, unspecified: Secondary | ICD-10-CM | POA: Insufficient documentation

## 2022-09-17 DIAGNOSIS — K5730 Diverticulosis of large intestine without perforation or abscess without bleeding: Secondary | ICD-10-CM | POA: Insufficient documentation

## 2022-09-17 DIAGNOSIS — L668 Other cicatricial alopecia: Secondary | ICD-10-CM

## 2022-09-17 DIAGNOSIS — L658 Other specified nonscarring hair loss: Secondary | ICD-10-CM

## 2022-09-17 DIAGNOSIS — L669 Cicatricial alopecia, unspecified: Secondary | ICD-10-CM

## 2022-09-17 DIAGNOSIS — K219 Gastro-esophageal reflux disease without esophagitis: Secondary | ICD-10-CM | POA: Insufficient documentation

## 2022-09-17 MED ORDER — SAFETY SEAL MISCELLANEOUS MISC
1.0000 | Freq: Every day | 4 refills | Status: DC
Start: 2022-09-17 — End: 2023-01-18

## 2022-09-17 MED ORDER — DOXYCYCLINE HYCLATE 100 MG PO TABS
100.0000 mg | ORAL_TABLET | Freq: Every day | ORAL | 4 refills | Status: AC
Start: 2022-09-17 — End: 2023-02-14

## 2022-09-17 NOTE — Patient Instructions (Addendum)
Thank you for visiting my office today and discussing your ongoing skin condition. 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 Here is a summary of the key instructions we discussed:   - Medications:   - Doxycycline: Start taking 100 mg daily with food. Continue for 4 months until next visit   - AA Gel: this prescription was sent to [facility] PLEASE BE AWARE THAT THIS MEDICATION ISN'T COVERED BY INSURANCE SO YOU WILL HAVE TO PAY OUT OF POCKET FOR THIS MEDICATION.   - Hair care:   - Washing: Continue to use sulfate and chemical free shampoo to prevent worsening damage to your scalp. Try to keep heat use very minimal to prevent worsening scalp and/or hair damage   - Lifestyle Adjustments:   - Try to avoid oils directly to the scalp, this will help with excessive itchy flares. It is ok to apply oils to your hair not scalp - Start Viviscal hair supplement and Collagen supplements to aid in health hair growth   - Follow-Up:   - Schedule a follow-up appointment in Four months to assess progress and make any necessary adjustments to your treatment plan.   Please start the prescribed treatments as discussed and monitor your symptoms closely. If you have any questions or if your condition worsens, do not hesitate to contact our office.       Due to recent changes in healthcare laws, you may see results of your pathology and/or laboratory studies on MyChart before the doctors have had a chance to review them. We understand that in some cases there may be results that are confusing or concerning to you. Please understand that not all results are received at the same time and often the doctors may need to interpret multiple results in order to provide you with the best plan of care or course of treatment. Therefore, we ask that you please give [place] 2 business days to thoroughly review all your results  before contacting the office for clarification. Should we see a critical lab result, you will be contacted sooner.   If You Need Anything After Your Visit  If you have any questions or concerns for your doctor, please call our main line at [phone] If no one answers, please leave a voicemail as directed and we will return your call as soon as possible. Messages left after 4 pm will be answered the following business day.   You may also send [place] a message via MyChart. We typically respond to MyChart messages within 1-2 business days.  For prescription refills, please ask your pharmacy to contact our office. Our fax number is [phone].  If you have an urgent issue when the clinic is closed that cannot wait until the next business day, you can page your doctor at the number below.    Please note that while we do our best to be available for urgent issues outside of office hours, we are not available 24/7.   If you have an urgent issue and are unable to reach [place], you may choose to seek medical care at your doctor's office, retail clinic, urgent care center, or emergency room.  If you have a medical emergency, please immediately call 911 or go to the emergency department. In the event of inclement weather, please call our main line at [phone] for an update on the status of any delays or closures.  Dermatology Medication Tips: Please keep the  boxes that topical medications come in in order to help keep track of the instructions about where and how to use these. Pharmacies typically print the medication instructions only on the boxes and not directly on the medication tubes.   If your medication is too expensive, please contact our office at [phone] or send [place] a message through MyChart.   We are unable to tell what your co-pay for medications will be in advance as this is different depending on your insurance coverage. However, we may be able to find a substitute medication at  lower cost or fill out paperwork to get insurance to cover a needed medication.   If a prior authorization is required to get your medication covered by your insurance company, please allow [place] 1-2 business days to complete this process.  Drug prices often vary depending on where the prescription is filled and some pharmacies may offer cheaper prices.  The website www.goodrx.com contains coupons for medications through different pharmacies. The prices here do not account for what the cost may be with help from insurance (it may be cheaper with your insurance), but the website can give you the price if you did not use any insurance.  - You can print the associated coupon and take it with your prescription to the pharmacy.  - You may also stop by our office during regular business hours and pick up a GoodRx coupon card.  - If you need your prescription sent electronically to a different pharmacy, notify our office through [facility] or by phone at [phone]

## 2022-09-17 NOTE — Progress Notes (Signed)
   New Patient Visit   Subjective  Christina Anthony is a 67 y.o. female who presents for the following: Hair Loss & Thinning  Patient states she has hair loss and thinning located at the scalp that she would like to have examined. Patient reports the areas have been there for  4-5  year(s), but has worsening over the past year. She reports the areas are bothersome.She reports the areas are itchy. She states that the areas have spread. Patient reports has not previously been treated for these areas.Currently she is using she is using Wild Growth hair oil,she is unsure of what shampoos because she goes to a salon every 2 weeks, with no improvement. She reports she previously used relaxer. Patient denies Hx of bx. Patient denies family history of skin cancer(s).    The following portions of the chart were reviewed this encounter and updated as appropriate: medications, allergies, medical history  Review of Systems:  No other skin or systemic complaints except as noted in HPI or Assessment and Plan.  Objective  Well appearing patient in no apparent distress; mood and affect are within normal limits.  A focused examination was performed of the following areas: Scalp  Relevant exam findings are noted in the Assessment and Plan.            Assessment & Plan   CCCA & Traction Alopecia Exam: Locate on vertex and frontal hair line Diffuse thinning of the crown and widening of the midline part with retention of the frontal hairline mild clincial sign of scarring  Not at goal  Treatment Plan: - We will prescribe AA Gel to [facility] in the morning, Start every other morning for 1 week, if tolerated well, increase to daily use - Doxycycline 100 mg daily for 4 months until we reassess in 4 months  Cicatricial alopecia  Related Medications doxycycline (VIBRA-TABS) 100 MG tablet Take 1 tablet (100 mg total) by mouth daily. TAKE WITH HEAVY MEALS  Safety Seal Miscellaneous  MISC Apply 1 Application topically daily. Medication Name: AA Gel Apply directly to scalp every other day for 1 week, if tolerated well, use daily    Return in about 4 months (around 01/18/2023) for Alopecia F/U.  Documentation: I have reviewed the above documentation for accuracy and completeness, and I agree with the above.  Stasia Cavalier, am acting as scribe for Langston Reusing, DO.  Langston Reusing, DO

## 2022-09-20 ENCOUNTER — Other Ambulatory Visit: Payer: Self-pay | Admitting: Internal Medicine

## 2022-09-20 DIAGNOSIS — E1165 Type 2 diabetes mellitus with hyperglycemia: Secondary | ICD-10-CM

## 2022-09-30 ENCOUNTER — Telehealth: Payer: Self-pay

## 2022-09-30 ENCOUNTER — Encounter: Payer: Self-pay | Admitting: Dermatology

## 2022-09-30 NOTE — Telephone Encounter (Signed)
She left a voicemail stating she thinks swelling in her leg is from the Doxycycline. I called her back and triaged the call asking her questions. She states she has one area on her left lower leg and one area on her right arm that have felt different/a little swollen since about one week ago. They are not painful, she has no fever, and feels well otherwise. I suggested she stop taking the Doxycycline until further notice. Forwarding to [facility] clinical dept since Dr. Onalee Hua is off

## 2022-09-30 NOTE — Telephone Encounter (Signed)
I called Ms Christina Anthony back after speaking with Dr. Onalee Hua. Pt agreed to try to upload photos to My Chart. I ask her more questions relating to symptoms. She has no weakness, dizziness, tingling, numbness, or shortness of breath. She says the swollen areas are by her right elbow, and the other is on the right lower leg. Her elbow is tender. On a scale from 1-10,10 being the worst it is a 2. I made sure she knew to call 911 and go to the ER if she feels any of the symptoms I questioned her about. She will stop the Doxycycline until further notice from Dr. Onalee Hua.

## 2022-10-06 ENCOUNTER — Encounter: Payer: Self-pay | Admitting: Internal Medicine

## 2022-10-06 ENCOUNTER — Telehealth (INDEPENDENT_AMBULATORY_CARE_PROVIDER_SITE_OTHER): Payer: Medicare Other | Admitting: Internal Medicine

## 2022-10-06 DIAGNOSIS — U071 COVID-19: Secondary | ICD-10-CM

## 2022-10-06 DIAGNOSIS — E7800 Pure hypercholesterolemia, unspecified: Secondary | ICD-10-CM

## 2022-10-06 DIAGNOSIS — F17210 Nicotine dependence, cigarettes, uncomplicated: Secondary | ICD-10-CM | POA: Insufficient documentation

## 2022-10-06 MED ORDER — NIRMATRELVIR/RITONAVIR (PAXLOVID)TABLET: 3.0000 | ORAL_TABLET | Freq: Two times a day (BID) | ORAL | 0 refills | Status: AC

## 2022-10-06 MED ORDER — BENZONATATE 100 MG PO CAPS: 100.0000 mg | ORAL_CAPSULE | Freq: Three times a day (TID) | ORAL | 1 refills | Status: DC | PRN

## 2022-10-06 NOTE — Progress Notes (Signed)
Virtual Visit via Video   This visit type was conducted due to national recommendations for restrictions regarding the COVID-19 Pandemic (e.g. social distancing) in an effort to limit this patient's exposure and mitigate transmission in our community.  Due to her co-morbid illnesses, this patient is at least at moderate risk for complications without adequate follow up.  This format is felt to be most appropriate for this patient at this time.  All issues noted in this document were discussed and addressed.  A limited physical exam was performed with this format.    This visit type was conducted due to national recommendations for restrictions regarding the COVID-19 Pandemic (e.g. social distancing) in an effort to limit this patient's exposure and mitigate transmission in our community.  Patients identity confirmed using two different identifiers.  This format is felt to be most appropriate for this patient at this time.  All issues noted in this document were discussed and addressed.  No physical exam was performed (except for noted visual exam findings with Video Visits).    Date:  10/11/2022   ID:  Larena Glassman, DOB June 13, 1955, MRN [id number]  Patient Location:  Home  Provider location:   Office    Chief Complaint:  "I have COVID"  History of Present Illness:    Sumita Kolbeck is a 67 y.o. female who presents via video conferencing for a telehealth visit today.    The patient does have symptoms concerning for COVID-19 infection (fever, chills, cough, or new shortness of breath).   She presents today for virtual visit. She wants to be seen for positive COVID test.  She states she thinks she got sick on Sunday at church.  She adds that they had revival at church last week.  On Sunday evening, she developed postnasal drip, sore throat and hacking cough. Cough caused her to have headache and back pain. She then developed sinus congestion.  Today, she did COVID test because  she felt she didn't sleep well last night.  States temp today is 99.8. Her home test is positive.      She would like treatment.      Past Medical History:  Diagnosis Date   Chronic uveitis of both eyes    Diabetes mellitus without complication (HCC)    GERD (gastroesophageal reflux disease)    no meds currently   Glaucoma (increased eye pressure)    Goiter    Hypertension    Past Surgical History:  Procedure Laterality Date   COLONOSCOPY     NO PAST SURGERIES     UPPER GASTROINTESTINAL ENDOSCOPY       Current Meds  Medication Sig   benzonatate (TESSALON PERLES) 100 MG capsule Take 1 capsule (100 mg total) by mouth 3 (three) times daily as needed for cough.   Blood Glucose Monitoring Suppl (CONTOUR BLOOD GLUCOSE SYSTEM) w/Device KIT USE ONCE DAILY TO CHECK BLOOD SUGARS.   Blood Glucose Monitoring Suppl DEVI May substitute to any manufacturer covered by patient's insurance.   brimonidine (ALPHAGAN) 0.2 % ophthalmic solution 1 drop 3 (three) times daily. 1 drop tid right eye, bid left eye   cetirizine (ZYRTEC) 10 MG tablet Take 10 mg by mouth daily.   Cholecalciferol (VITAMIN D3) 25 MCG (1000 UT) CAPS Take by mouth.   dapagliflozin propanediol (FARXIGA) 10 MG TABS tablet Take 1 tablet (10 mg total) by mouth daily.   dorzolamide-timolol (COSOPT) 22.3-6.8 MG/ML ophthalmic solution Place 1 drop into both eyes 2 (two) times daily.  Folic Acid 5 MG CAPS daily.    methotrexate (RHEUMATREX) 2.5 MG tablet 2.5 mg. 8 tabs weekly   Multiple Vitamins-Minerals (CENTRUM SILVER 50+WOMEN) TABS Take by mouth.   nirmatrelvir/ritonavir (PAXLOVID) 20 x 150 MG & 10 x 100MG  TABS Take 3 tablets by mouth 2 (two) times daily for 5 days. Patient GFR is 67.   Omega-3 Fatty Acids (FISH OIL PO) Take by mouth.   omeprazole (PRILOSEC) 20 MG capsule Take by mouth.   ONETOUCH ULTRA test strip 1 EACH BY IN VITRO ROUTE IN THE MORNING, AT NOON, AND AT BEDTIME.   Safety Seal Miscellaneous MISC Apply 1 Application  topically daily. Medication Name: AA Gel Apply directly to scalp every other day for 1 week, if tolerated well, use daily   triamterene-hydrochlorothiazide (MAXZIDE-25) 37.5-25 MG tablet TAKE 1 TABLET BY MOUTH EVERY DAY   TURMERIC PO Take 1 capsule by mouth daily.   [DISCONTINUED] pravastatin (PRAVACHOL) 80 MG tablet TAKE 1 TABLET BY MOUTH EVERY DAY     Allergies:   Patient has no known allergies.   Social History   Tobacco Use   Smoking status: Every Day    Current packs/day: 0.25    Average packs/day: 0.3 packs/day for 48.0 years (12.0 ttl pk-yrs)    Types: Cigarettes   Smokeless tobacco: Never   Tobacco comments:    decrease number of cigs smoked per day  Vaping Use   Vaping status: Never Used  Substance Use Topics   Alcohol use: Yes    Alcohol/week: 1.0 standard drink of alcohol    Types: 1 Cans of beer per week    Comment: daily   Drug use: No     Family Hx: The patient's family history includes Cancer in her sister; Diabetes in her mother; Glaucoma in her mother; Hypertension in her father and mother.  ROS:   Please see the history of present illness.    Review of Systems  Constitutional:  Positive for fever and malaise/fatigue.  HENT:  Positive for congestion.   Eyes: Negative.   Respiratory:  Positive for cough.   Cardiovascular: Negative.   Gastrointestinal: Negative.   Neurological: Negative.   Psychiatric/Behavioral: Negative.      All other systems reviewed and are negative.   Labs/Other Tests and Data Reviewed:    Recent Labs: 05/07/2022: ALT 19; TSH 0.563 08/11/2022: BUN 16; Creatinine, Ser 0.94; Potassium 3.6; Sodium 143   Recent Lipid Panel Lab Results  Component Value Date/Time   CHOL 182 12/17/2021 12:19 PM   TRIG 123 12/17/2021 12:19 PM   HDL 46 12/17/2021 12:19 PM   CHOLHDL 4.0 12/17/2021 12:19 PM   LDLCALC 114 (H) 12/17/2021 12:19 PM    Wt Readings from Last 3 Encounters:  08/11/22 180 lb 12.8 oz (82 kg)  06/25/22 184 lb (83.5 kg)   06/25/22 184 lb 9.6 oz (83.7 kg)     Exam:    Vital Signs:  There were no vitals taken for this visit.    Physical Exam Vitals and nursing note reviewed.  Constitutional:      Appearance: Normal appearance.  HENT:     Head: Normocephalic and atraumatic.  Eyes:     Extraocular Movements: Extraocular movements intact.  Pulmonary:     Effort: Pulmonary effort is normal.     Comments: She is able to speak in full sentences.without labored breathing Musculoskeletal:     Cervical back: Normal range of motion.  Neurological:     Mental Status: She is alert and  oriented to person, place, and time.  Psychiatric:        Mood and Affect: Affect normal.     ASSESSMENT & PLAN:    COVID-19 Assessment & Plan:  She would like treatment, rx paxlovid sent to her pharmacy. Advised to take full course, possible side effects d/w patient. She was advised to avoid taking her cholesterol medication, pravastatin, for TEN days.  I will also refer her for home monitoring/temperature monitoring program. She is encouraged to email me daily on Mychart to let me know how she is doing. She is encouraged to go to ER should she develop worsening SOB. She is also advised to stay well hydrated, move periodically throughout the day and to have a hot beverage daily.  She verbalizes understanding of her treatment plan. All questions were answered to her satisfaction. She understands that she needs to continue to self quarantine while at home.   Orders: -     MyChart Temperature FLOWSHEET; Future  Pure hypercholesterolemia Assessment & Plan: Again, pt advised to HOLD pravastatin for TEN days since there is an interaction with paxlovid. She verbalized understanding of her treatment plan.    Other orders -     [facility] COVID-19 HOME MONITORING PROGRAM; Future -     nirmatrelvir/ritonavir; Take 3 tablets by mouth 2 (two) times daily for 5 days. Patient GFR is 67.  Dispense: 30 tablet; Refill: 0 -     Benzonatate;  Take 1 capsule (100 mg total) by mouth 3 (three) times daily as needed for cough.  Dispense: 30 capsule; Refill: 1     COVID-19 Education: The signs and symptoms of COVID-19 were discussed with the patient and how to seek care for testing (follow up with PCP or arrange E-visit).  The importance of social distancing was discussed today.  Patient Risk:   After full review of this patients clinical status, I feel that they are at least moderate risk at this time.  Time:   Today, I have spent 13 minutes/ seconds with the patient with telehealth technology discussing above diagnoses.     Medication Adjustments/Labs and Tests Ordered: Current medicines are reviewed at length with the patient today.  Concerns regarding medicines are outlined above.   Tests Ordered: No orders of the defined types were placed in this encounter.   Medication Changes: Meds ordered this encounter  Medications   nirmatrelvir/ritonavir (PAXLOVID) 20 x 150 MG & 10 x 100MG  TABS    Sig: Take 3 tablets by mouth 2 (two) times daily for 5 days. Patient GFR is 67.    Dispense:  30 tablet    Refill:  0   benzonatate (TESSALON PERLES) 100 MG capsule    Sig: Take 1 capsule (100 mg total) by mouth 3 (three) times daily as needed for cough.    Dispense:  30 capsule    Refill:  1    Disposition:  Follow up prn  Signed, Gwynneth Aliment, MD

## 2022-10-08 ENCOUNTER — Other Ambulatory Visit: Payer: Self-pay | Admitting: Internal Medicine

## 2022-10-11 ENCOUNTER — Encounter: Payer: Self-pay | Admitting: Internal Medicine

## 2022-10-11 NOTE — Assessment & Plan Note (Signed)
Again, pt advised to HOLD pravastatin for TEN days since there is an interaction with paxlovid. She verbalized understanding of her treatment plan.

## 2022-10-11 NOTE — Assessment & Plan Note (Signed)
She would like treatment, rx paxlovid sent to her pharmacy. Advised to take full course, possible side effects d/w patient. She was advised to avoid taking her cholesterol medication, pravastatin, for TEN days.  I will also refer her for home monitoring/temperature monitoring program. She is encouraged to email me daily on Mychart to let me know how she is doing. She is encouraged to go to ER should she develop worsening SOB. She is also advised to stay well hydrated, move periodically throughout the day and to have a hot beverage daily.  She verbalizes understanding of her treatment plan. All questions were answered to her satisfaction. She understands that she needs to continue to self quarantine while at home.

## 2022-10-12 ENCOUNTER — Encounter: Payer: Medicare Other | Admitting: Internal Medicine

## 2022-10-15 ENCOUNTER — Encounter (INDEPENDENT_AMBULATORY_CARE_PROVIDER_SITE_OTHER): Payer: Self-pay

## 2022-11-11 ENCOUNTER — Encounter: Payer: Self-pay | Admitting: Internal Medicine

## 2022-11-11 ENCOUNTER — Other Ambulatory Visit: Payer: Self-pay

## 2022-11-11 MED ORDER — DAPAGLIFLOZIN PROPANEDIOL 10 MG PO TABS: 10.0000 mg | ORAL_TABLET | Freq: Every day | ORAL | 2 refills | Status: DC

## 2022-11-24 ENCOUNTER — Other Ambulatory Visit: Payer: Self-pay | Admitting: Internal Medicine

## 2022-11-26 ENCOUNTER — Encounter: Payer: Medicare Other | Admitting: Internal Medicine

## 2022-12-01 ENCOUNTER — Ambulatory Visit (INDEPENDENT_AMBULATORY_CARE_PROVIDER_SITE_OTHER): Payer: Medicare Other

## 2022-12-01 VITALS — BP 122/84 | HR 65 | Temp 97.9°F | Ht 63.0 in | Wt 180.0 lb

## 2022-12-01 DIAGNOSIS — H401113 Primary open-angle glaucoma, right eye, severe stage: Secondary | ICD-10-CM | POA: Diagnosis not present

## 2022-12-01 DIAGNOSIS — Z23 Encounter for immunization: Secondary | ICD-10-CM

## 2022-12-01 DIAGNOSIS — Z961 Presence of intraocular lens: Secondary | ICD-10-CM | POA: Diagnosis not present

## 2022-12-01 DIAGNOSIS — Z79899 Other long term (current) drug therapy: Secondary | ICD-10-CM | POA: Diagnosis not present

## 2022-12-01 DIAGNOSIS — H35033 Hypertensive retinopathy, bilateral: Secondary | ICD-10-CM | POA: Diagnosis not present

## 2022-12-01 DIAGNOSIS — H2013 Chronic iridocyclitis, bilateral: Secondary | ICD-10-CM | POA: Diagnosis not present

## 2022-12-01 DIAGNOSIS — H401122 Primary open-angle glaucoma, left eye, moderate stage: Secondary | ICD-10-CM | POA: Diagnosis not present

## 2022-12-01 DIAGNOSIS — H35371 Puckering of macula, right eye: Secondary | ICD-10-CM | POA: Diagnosis not present

## 2022-12-01 NOTE — Progress Notes (Signed)
Patient presents today for flu vaccine [facility]

## 2022-12-14 ENCOUNTER — Encounter: Payer: Self-pay | Admitting: Internal Medicine

## 2022-12-14 ENCOUNTER — Ambulatory Visit (INDEPENDENT_AMBULATORY_CARE_PROVIDER_SITE_OTHER): Payer: Medicare Other | Admitting: Internal Medicine

## 2022-12-14 VITALS — BP 104/70 | HR 73 | Temp 98.5°F | Ht 63.0 in | Wt 180.6 lb

## 2022-12-14 DIAGNOSIS — E66811 Obesity, class 1: Secondary | ICD-10-CM

## 2022-12-14 DIAGNOSIS — Z6831 Body mass index (BMI) 31.0-31.9, adult: Secondary | ICD-10-CM

## 2022-12-14 DIAGNOSIS — Z23 Encounter for immunization: Secondary | ICD-10-CM | POA: Diagnosis not present

## 2022-12-14 DIAGNOSIS — I10 Essential (primary) hypertension: Secondary | ICD-10-CM

## 2022-12-14 DIAGNOSIS — E1169 Type 2 diabetes mellitus with other specified complication: Secondary | ICD-10-CM

## 2022-12-14 DIAGNOSIS — E7800 Pure hypercholesterolemia, unspecified: Secondary | ICD-10-CM | POA: Diagnosis not present

## 2022-12-14 DIAGNOSIS — E1165 Type 2 diabetes mellitus with hyperglycemia: Secondary | ICD-10-CM | POA: Diagnosis not present

## 2022-12-14 DIAGNOSIS — E785 Hyperlipidemia, unspecified: Secondary | ICD-10-CM

## 2022-12-14 DIAGNOSIS — E6609 Other obesity due to excess calories: Secondary | ICD-10-CM

## 2022-12-14 LAB — LIPID PANEL
Chol/HDL Ratio: 3.6 {ratio} (ref 0.0–4.4)
Cholesterol, Total: 173 mg/dL (ref 100–199)
HDL: 48 mg/dL (ref >39)
LDL Chol Calc (NIH): 105 mg/dL — ABNORMAL HIGH (ref 0–99)
Triglycerides: 108 mg/dL (ref 0–149)
VLDL Cholesterol Cal: 20 mg/dL (ref 5–40)

## 2022-12-14 LAB — CBC
Hematocrit: 41.3 % (ref 34.0–46.6)
Hemoglobin: 13.7 g/dL (ref 11.1–15.9)
MCH: 29.6 pg (ref 26.6–33.0)
MCHC: 33.2 g/dL (ref 31.5–35.7)
MCV: 89 fL (ref 79–97)
Platelets: 304 10*3/uL (ref 150–450)
RBC: 4.63 x10E6/uL (ref 3.77–5.28)
RDW: 14.2 % (ref 11.7–15.4)
WBC: 8.8 10*3/uL (ref 3.4–10.8)

## 2022-12-14 LAB — CMP14+EGFR
ALT: 18 [IU]/L (ref 0–32)
AST: 16 [IU]/L (ref 0–40)
Albumin: 4.4 g/dL (ref 3.9–4.9)
Alkaline Phosphatase: 81 [IU]/L (ref 44–121)
BUN/Creatinine Ratio: 23 (ref 12–28)
BUN: 20 mg/dL (ref 8–27)
Bilirubin Total: 0.3 mg/dL (ref 0.0–1.2)
CO2: 25 mmol/L (ref 20–29)
Calcium: 10.3 mg/dL (ref 8.7–10.3)
Chloride: 102 mmol/L (ref 96–106)
Creatinine, Ser: 0.88 mg/dL (ref 0.57–1.00)
Globulin, Total: 2.5 g/dL (ref 1.5–4.5)
Glucose: 107 mg/dL — ABNORMAL HIGH (ref 70–99)
Potassium: 3.7 mmol/L (ref 3.5–5.2)
Sodium: 142 mmol/L (ref 134–144)
Total Protein: 6.9 g/dL (ref 6.0–8.5)
eGFR: 72 mL/min/{1.73_m2} (ref >59)

## 2022-12-14 LAB — HEMOGLOBIN A1C
Est. average glucose Bld gHb Est-mCnc: 137 mg/dL
Hgb A1c MFr Bld: 6.4 % — ABNORMAL HIGH (ref 4.8–5.6)

## 2022-12-14 NOTE — Progress Notes (Signed)
I,Victoria T Deloria Lair, CMA,acting as a Neurosurgeon for Gwynneth Aliment, MD.,have documented all relevant documentation on the behalf of Gwynneth Aliment, MD,as directed by  Gwynneth Aliment, MD while in the presence of Gwynneth Aliment, MD.  Subjective:  Patient ID: Christina Anthony , female    DOB: 11/27/1955 , 67 y.o.   MRN: [id number]  Chief Complaint  Patient presents with   Diabetes   Hypertension    HPI  Patient presents today for diabetes and BP check. She reports compliance with medications. Denies headache, chest pain & sob. She has no new concerns or complaints at this time.   She reports she is scheduled to see Ophthalmologist next week.        Diabetes She presents for her follow-up diabetic visit. She has type 2 diabetes mellitus. Her disease course has been stable. There are no hypoglycemic associated symptoms. Pertinent negatives for diabetes include no blurred vision and no chest pain. There are no hypoglycemic complications. Risk factors for coronary artery disease include diabetes mellitus, dyslipidemia, hypertension and post-menopausal. When asked about current treatments, none were reported. She participates in exercise three times a week. An ACE inhibitor/angiotensin II receptor blocker is being taken.  Hypertension This is a chronic problem. The current episode started more than 1 year ago. The problem has been gradually improving since onset. The problem is controlled. Pertinent negatives include no blurred vision, chest pain, palpitations or shortness of breath. Risk factors for coronary artery disease include post-menopausal state and sedentary lifestyle. Past treatments include diuretics. The current treatment provides moderate improvement. Compliance problems include exercise.      Past Medical History:  Diagnosis Date   Chronic uveitis of both eyes    Diabetes mellitus without complication (HCC)    GERD (gastroesophageal reflux disease)    no meds currently    Glaucoma (increased eye pressure)    Goiter    Hypertension      Family History  Problem Relation Age of Onset   Diabetes Mother    Hypertension Mother    Glaucoma Mother    Hypertension Father    Cancer Sister      Current Outpatient Medications:    benzonatate (TESSALON PERLES) 100 MG capsule, Take 1 capsule (100 mg total) by mouth 3 (three) times daily as needed for cough., Disp: 30 capsule, Rfl: 1   Blood Glucose Monitoring Suppl (CONTOUR BLOOD GLUCOSE SYSTEM) w/Device KIT, USE ONCE DAILY TO CHECK BLOOD SUGARS., Disp: 1 kit, Rfl: 0   Blood Glucose Monitoring Suppl DEVI, May substitute to any manufacturer covered by patient's insurance., Disp: 1 each, Rfl: 0   brimonidine (ALPHAGAN) 0.2 % ophthalmic solution, 1 drop 3 (three) times daily. 1 drop tid right eye, bid left eye, Disp: , Rfl:    cetirizine (ZYRTEC) 10 MG tablet, Take 10 mg by mouth daily., Disp: , Rfl:    Cholecalciferol (VITAMIN D3) 25 MCG (1000 UT) CAPS, Take by mouth., Disp: , Rfl:    dapagliflozin propanediol (FARXIGA) 10 MG TABS tablet, Take 1 tablet (10 mg total) by mouth daily., Disp: 90 tablet, Rfl: 2   dorzolamide-timolol (COSOPT) 22.3-6.8 MG/ML ophthalmic solution, Place 1 drop into both eyes 2 (two) times daily., Disp: , Rfl:    Folic Acid 5 MG CAPS, daily. , Disp: , Rfl:    methotrexate (RHEUMATREX) 2.5 MG tablet, 2.5 mg. 8 tabs weekly, Disp: , Rfl:    Multiple Vitamins-Minerals (CENTRUM SILVER 50+WOMEN) TABS, Take by mouth., Disp: , Rfl:  Omega-3 Fatty Acids (FISH OIL PO), Take by mouth., Disp: , Rfl:    omeprazole (PRILOSEC) 20 MG capsule, Take by mouth., Disp: , Rfl:    ONETOUCH ULTRA test strip, 1 EACH BY IN VITRO ROUTE IN THE MORNING, AT NOON, AND AT BEDTIME., Disp: 100 strip, Rfl: 0   pravastatin (PRAVACHOL) 80 MG tablet, TAKE 1 TABLET BY MOUTH EVERY DAY, Disp: 90 tablet, Rfl: 1   Safety Seal Miscellaneous MISC, Apply 1 Application topically daily. Medication Name: AA Gel Apply directly to scalp every  other day for 1 week, if tolerated well, use daily, Disp: 30 g, Rfl: 4   triamterene-hydrochlorothiazide (MAXZIDE-25) 37.5-25 MG tablet, TAKE 1 TABLET BY MOUTH EVERY DAY, Disp: 90 tablet, Rfl: 2   TURMERIC PO, Take 1 capsule by mouth daily., Disp: , Rfl:    Biotin (BIOTIN MAXIMUM STRENGTH) 10 MG TABS, Take 1 capsule by mouth daily. (Patient not taking: Reported on 10/06/2022), Disp: , Rfl:    doxycycline (VIBRA-TABS) 100 MG tablet, Take 1 tablet (100 mg total) by mouth daily. TAKE WITH HEAVY MEALS (Patient not taking: Reported on 10/06/2022), Disp: 30 tablet, Rfl: 4   No Known Allergies   Review of Systems  Constitutional: Negative.   Eyes:  Negative for blurred vision.  Respiratory: Negative.  Negative for shortness of breath.   Cardiovascular: Negative.  Negative for chest pain and palpitations.  Gastrointestinal: Negative.   Neurological: Negative.   Psychiatric/Behavioral: Negative.       Today's Vitals   12/14/22 0858  BP: 104/70  Pulse: 73  Temp: 98.5 F (36.9 C)  SpO2: 98%  Weight: 180 lb 9.6 oz (81.9 kg)  Height: 5\' 3"  (1.6 m)   Body mass index is 31.99 kg/m.  Wt Readings from Last 3 Encounters:  12/14/22 180 lb 9.6 oz (81.9 kg)  12/01/22 180 lb (81.6 kg)  08/11/22 180 lb 12.8 oz (82 kg)     Objective:  Physical Exam Vitals and nursing note reviewed.  Constitutional:      Appearance: Normal appearance. She is obese.  HENT:     Head: Normocephalic and atraumatic.  Eyes:     Extraocular Movements: Extraocular movements intact.  Cardiovascular:     Rate and Rhythm: Normal rate and regular rhythm.     Heart sounds: Normal heart sounds.  Pulmonary:     Effort: Pulmonary effort is normal.     Breath sounds: Normal breath sounds.  Musculoskeletal:     Cervical back: Normal range of motion.  Skin:    General: Skin is warm.  Neurological:     General: No focal deficit present.     Mental Status: She is alert.  Psychiatric:        Mood and Affect: Mood normal.         Behavior: Behavior normal.         Assessment And Plan:  Dyslipidemia associated with type 2 diabetes mellitus (HCC) Assessment & Plan: Chronic, LDL goal is less than 70.  She will continue with pravastatin 80mg  daily. Regarding DM, she will continue with [place] 10mg  daily. Encouraged to follow dietary recommendations.   Orders: -     CBC -     CMP14+EGFR -     Lipid panel -     Hemoglobin A1c  Essential hypertension, benign Assessment & Plan: Chronic, well controlled. She will continue with triamterene/hydrochlorothiazide 37.5/25mg  daily for now. Will consider starting ARB in the near future.   Orders: -     CMP14+EGFR  Class 1 obesity due to excess calories with serious comorbidity and body mass index (BMI) of 31.0 to 31.9 in adult Assessment & Plan: She is encouraged to strive for BMI less than 30 to decrease cardiac risk. Advised to aim for at least 150 minutes of exercise per week.    Immunization due -     Pfizer Comirnaty Covid-19 Vaccine 4yrs & older  She is encouraged to strive for BMI less than 30 to decrease cardiac risk. Advised to aim for at least 150 minutes of exercise per week.    Return in 4 months (on 04/16/2023), or physical exam.  Patient was given opportunity to ask questions. Patient verbalized understanding of the plan and was able to repeat key elements of the plan. All questions were answered to their satisfaction.    I, Gwynneth Aliment, MD, have reviewed all documentation for this visit. The documentation on 12/14/22 for the exam, diagnosis, procedures, and orders are all accurate and complete.   IF YOU HAVE BEEN REFERRED TO A SPECIALIST, IT MAY TAKE 1-2 WEEKS TO SCHEDULE/PROCESS THE REFERRAL. IF YOU HAVE NOT HEARD FROM US/SPECIALIST IN TWO WEEKS, PLEASE GIVE [place] A CALL AT [phone].   THE PATIENT IS ENCOURAGED TO PRACTICE SOCIAL DISTANCING DUE TO THE COVID-19 PANDEMIC.

## 2022-12-14 NOTE — Patient Instructions (Signed)

## 2022-12-17 ENCOUNTER — Other Ambulatory Visit: Payer: Self-pay | Admitting: Internal Medicine

## 2022-12-17 DIAGNOSIS — Z1231 Encounter for screening mammogram for malignant neoplasm of breast: Secondary | ICD-10-CM

## 2022-12-20 DIAGNOSIS — E1165 Type 2 diabetes mellitus with hyperglycemia: Secondary | ICD-10-CM | POA: Insufficient documentation

## 2022-12-20 DIAGNOSIS — E1169 Type 2 diabetes mellitus with other specified complication: Secondary | ICD-10-CM | POA: Insufficient documentation

## 2022-12-20 NOTE — Assessment & Plan Note (Signed)
She is encouraged to strive for BMI less than 30 to decrease cardiac risk. Advised to aim for at least 150 minutes of exercise per week.

## 2022-12-20 NOTE — Assessment & Plan Note (Signed)
Chronic, LDL goal is less than 70.  She will continue with pravastatin 80mg  daily. Regarding DM, she will continue with [place] 10mg  daily. Encouraged to follow dietary recommendations.

## 2022-12-20 NOTE — Assessment & Plan Note (Signed)
Chronic, well controlled. She will continue with triamterene/hydrochlorothiazide 37.5/25mg  daily for now. Will consider starting ARB in the near future.

## 2022-12-23 DIAGNOSIS — H401113 Primary open-angle glaucoma, right eye, severe stage: Secondary | ICD-10-CM | POA: Diagnosis not present

## 2022-12-23 DIAGNOSIS — H401122 Primary open-angle glaucoma, left eye, moderate stage: Secondary | ICD-10-CM | POA: Diagnosis not present

## 2023-01-12 ENCOUNTER — Ambulatory Visit: Payer: Medicare Other

## 2023-01-13 ENCOUNTER — Ambulatory Visit: Payer: Medicare Other

## 2023-01-15 ENCOUNTER — Ambulatory Visit
Admission: RE | Admit: 2023-01-15 | Discharge: 2023-01-15 | Disposition: A | Discharge status: Home / Self Care | Payer: Medicare Other | Source: Ambulatory Visit | Attending: Internal Medicine | Admitting: Internal Medicine

## 2023-01-15 DIAGNOSIS — Z1231 Encounter for screening mammogram for malignant neoplasm of breast: Secondary | ICD-10-CM

## 2023-01-18 ENCOUNTER — Ambulatory Visit: Payer: Medicare Other | Admitting: Dermatology

## 2023-01-18 ENCOUNTER — Encounter: Payer: Self-pay | Admitting: Dermatology

## 2023-01-18 DIAGNOSIS — R609 Edema, unspecified: Secondary | ICD-10-CM | POA: Diagnosis not present

## 2023-01-18 DIAGNOSIS — L669 Cicatricial alopecia, unspecified: Secondary | ICD-10-CM

## 2023-01-18 DIAGNOSIS — L6681 Central centrifugal cicatricial alopecia: Secondary | ICD-10-CM | POA: Diagnosis not present

## 2023-01-18 DIAGNOSIS — L658 Other specified nonscarring hair loss: Secondary | ICD-10-CM | POA: Diagnosis not present

## 2023-01-18 MED ORDER — SAFETY SEAL MISCELLANEOUS MISC: 1.0000 | Freq: Every day | 4 refills | Status: DC

## 2023-01-18 MED ORDER — CLOBETASOL PROPIONATE 0.05 % EX SOLN: 1.0000 | Freq: Every evening | CUTANEOUS | 1 refills | Status: AC

## 2023-01-18 NOTE — Patient Instructions (Addendum)
[name],  Thank you for visiting [place] today. We appreciate your commitment to improving your health and are pleased to see progress in your treatment. Here is a summary of the key instructions from today's consultation:  - AA Gel: Continue using AA Gel (clobetasol and minoxidil) every morning.   - Application: Ensure to apply only a thin layer to maximize efficacy without overuse.  - Clobetasol Drops: Use at nighttime, as prescribed.   - Cost-Saving Tip: If the cost of clobetasol drops is high at your regular pharmacy, consider using a GoodRx coupon to reduce the price.  - Supplements: Continue taking Viviscal and collagen supplements as previously advised.  - Monitoring: Monitor your progress, and if there is no significant improvement by the next visit, we may consider starting injections.  - Follow-Up: Appointment in four months.  We look forward to seeing you again and wish you a wonderful holiday season.  Warm regards,  Dr. Langston Reusing, Dermatology   Important Information  Due to recent changes in healthcare laws, you may see results of your pathology and/or laboratory studies on MyChart before the doctors have had a chance to review them. We understand that in some cases there may be results that are confusing or concerning to you. Please understand that not all results are received at the same time and often the doctors may need to interpret multiple results in order to provide you with the best plan of care or course of treatment. Therefore, we ask that you please give [place] 2 business days to thoroughly review all your results before contacting the office for clarification. Should we see a critical lab result, you will be contacted sooner.   If You Need Anything After Your Visit  If you have any questions or concerns for your doctor, please call our main line at [phone] If no one answers, please leave a voicemail as directed and we will return your call as soon as  possible. Messages left after 4 pm will be answered the following business day.   You may also send [place] a message via MyChart. We typically respond to MyChart messages within 1-2 business days.  For prescription refills, please ask your pharmacy to contact our office. Our fax number is [phone].  If you have an urgent issue when the clinic is closed that cannot wait until the next business day, you can page your doctor at the number below.    Please note that while we do our best to be available for urgent issues outside of office hours, we are not available 24/7.   If you have an urgent issue and are unable to reach [place], you may choose to seek medical care at your doctor's office, retail clinic, urgent care center, or emergency room.  If you have a medical emergency, please immediately call 911 or go to the emergency department. In the event of inclement weather, please call our main line at [phone] for an update on the status of any delays or closures.  Dermatology Medication Tips: Please keep the boxes that topical medications come in in order to help keep track of the instructions about where and how to use these. Pharmacies typically print the medication instructions only on the boxes and not directly on the medication tubes.   If your medication is too expensive, please contact our office at [phone] or send [place] a message through MyChart.   We are unable to tell what your co-pay for medications will be in advance as this is  different depending on your insurance coverage. However, we may be able to find a substitute medication at lower cost or fill out paperwork to get insurance to cover a needed medication.   If a prior authorization is required to get your medication covered by your insurance company, please allow [place] 1-2 business days to complete this process.  Drug prices often vary depending on where the prescription is filled and some pharmacies may offer cheaper  prices.  The website www.goodrx.com contains coupons for medications through different pharmacies. The prices here do not account for what the cost may be with help from insurance (it may be cheaper with your insurance), but the website can give you the price if you did not use any insurance.  - You can print the associated coupon and take it with your prescription to the pharmacy.  - You may also stop by our office during regular business hours and pick up a GoodRx coupon card.  - If you need your prescription sent electronically to a different pharmacy, notify our office through [facility] or by phone at [phone]

## 2023-01-18 NOTE — Progress Notes (Signed)
   Follow-Up Visit   Subjective  Christina Anthony is a 67 y.o. female who presents for the following: CCCA  Patient present today for follow up visit for follow. Patient was last evaluated on 09/17/22. She was Rx Doxycycline and AA Gel from [facility]. She stated she d/c Doxycycline as she was concerned about leg swelling after starting that. She has been consistently using the AA Gel, Viviscal and Vital Proteins supplements. Patient reports sxs are better. Patient denies changes.  The following portions of the chart were reviewed this encounter and updated as appropriate: medications, allergies, medical history  Review of Systems:  No other skin or systemic complaints except as noted in HPI or Assessment and Plan.  Objective  Well appearing patient in no apparent distress; mood and affect are within normal limits.   A focused examination was performed of the following areas: scalp   Relevant exam findings are noted in the Assessment and Plan.          Assessment & Plan   CCCA Exam:    Treatment Plan: - Continue using AA Gel from [facility] - Continue using the OTC Viviscal and Vital Protein supplements - Rx Clobetasol 0.05% solution to use topically nightly on affected areas - Discussed doing kenalog injections of topicals plateau.   1. Traction Alopecia(Improved overall)  - Assessment: Patient tolerates AA Gel (clobetasol and minoxidil) well with no irritation. - Plan: Continue using AA Gel every morning. Encourage avoidance of tight hairstyles and excessive tension on the hair.  2. Central Centrifugal Cicatricial Alopecia (CCCA) (stable) - Assessment: Swelling side effect from doxycycline. - Plan: Discontinue doxycycline. Prescribe clobetasol drops for nighttime use (QHS) to control inflammation.  -Monitor progress and consider Kenalog injections in 3-4 months if results plateau or do not improve sufficiently. - Discussed Kenalog injections vs. clobetasol drops; patient  opted for drops due to cost considerations. -Encourage continuation of Viviscal and collagen supplements.  3. Swelling (Possibly Related to Doxycycline or Farxiga) - Assessment: Swelling improved after discontinuing doxycycline. - Plan: Monitor swelling and advise patient to discuss Farxiga with Dr. Allyne Gee.  Follow-up in four months to assess progress and discuss further treatment options if necessary. Encourage patient to contact the office if any concerns or side effects arise before the next appointment.  Additional Notes: - Advised on proper application of topical medications (thin layer, rubbing in well). - Discussed potential costs of medications and suggested using GoodRx for potential savings on clobetasol drops. - Observed some progress in hair growth, particularly along the edges.  Cicatricial alopecia  Related Medications doxycycline (VIBRA-TABS) 100 MG tablet Take 1 tablet (100 mg total) by mouth daily. TAKE WITH HEAVY MEALS  Safety Seal Miscellaneous MISC Apply 1 Application topically daily. Medication Name: AA Gel Apply directly to scalp every other day for 1 week, if tolerated well, use daily  clobetasol (TEMOVATE) 0.05 % external solution Apply 1 Application topically at bedtime.    Return in about 4 months (around 05/18/2023) for CCCA.    Documentation: I have reviewed the above documentation for accuracy and completeness, and I agree with the above.   I, Shirron Marcha Solders, CMA, am acting as scribe for [employer], DO.   Langston Reusing, DO

## 2023-02-15 ENCOUNTER — Encounter: Payer: Self-pay | Admitting: Internal Medicine

## 2023-02-15 NOTE — Telephone Encounter (Signed)
 Care team updated and letter sent for eye exam notes.

## 2023-04-10 ENCOUNTER — Other Ambulatory Visit: Payer: Self-pay | Admitting: Internal Medicine

## 2023-04-21 ENCOUNTER — Ambulatory Visit: Payer: Medicare PPO | Admitting: Internal Medicine

## 2023-04-21 ENCOUNTER — Encounter: Payer: Self-pay | Admitting: Internal Medicine

## 2023-04-21 VITALS — BP 110/78 | HR 73 | Temp 98.4°F | Ht 63.0 in | Wt 177.4 lb

## 2023-04-21 DIAGNOSIS — I10 Essential (primary) hypertension: Secondary | ICD-10-CM

## 2023-04-21 DIAGNOSIS — E66811 Obesity, class 1: Secondary | ICD-10-CM

## 2023-04-21 DIAGNOSIS — E1169 Type 2 diabetes mellitus with other specified complication: Secondary | ICD-10-CM | POA: Diagnosis not present

## 2023-04-21 DIAGNOSIS — E785 Hyperlipidemia, unspecified: Secondary | ICD-10-CM

## 2023-04-21 DIAGNOSIS — Z6831 Body mass index (BMI) 31.0-31.9, adult: Secondary | ICD-10-CM

## 2023-04-21 DIAGNOSIS — E6609 Other obesity due to excess calories: Secondary | ICD-10-CM

## 2023-04-21 DIAGNOSIS — J0100 Acute maxillary sinusitis, unspecified: Secondary | ICD-10-CM | POA: Diagnosis not present

## 2023-04-21 MED ORDER — AMOXICILLIN-POT CLAVULANATE 875-125 MG PO TABS: 1.0000 | ORAL_TABLET | Freq: Two times a day (BID) | ORAL | 0 refills | Status: DC

## 2023-04-21 MED ORDER — BENZONATATE 100 MG PO CAPS: 100.0000 mg | ORAL_CAPSULE | Freq: Three times a day (TID) | ORAL | 1 refills | Status: AC | PRN

## 2023-04-21 NOTE — Patient Instructions (Signed)

## 2023-04-21 NOTE — Assessment & Plan Note (Addendum)
Chronic, LDL goal is less than 70.  She will continue with pravastatin 80mg  daily. Regarding DM, she will continue with [place] 10mg  daily. . Encouraged to follow dietary recommendations. She will f/u in 3-4 months.

## 2023-04-21 NOTE — Progress Notes (Signed)
I,Jameka J Llittleton, CMA,acting as a Neurosurgeon for Gwynneth Aliment, MD.,have documented all relevant documentation on the behalf of Gwynneth Aliment, MD,as directed by  Gwynneth Aliment, MD while in the presence of Gwynneth Aliment, MD.  Subjective:  Patient ID: Christina Anthony , female    DOB: [birth year] , 68 y.o.   MRN: [id number]  Chief Complaint  Patient presents with   Diabetes   Hypertension    HPI  Patient presents today for diabetes and BP check. She reports compliance with medications. Denies headache, chest pain & sob.  She states her sugars average around 119.    Today, she c/o sinus congestion, cough and hoarseness.  Her sx started last Thursday.  She thinks she got sick from her great-grandchildren.  She also adds that she was out in the rain this past week.  She has taken Tylenol sinus with minimal relief of her symptoms. She also denies fever/chills.   She took at home Covid test on Thursday, negative result.     Diabetes She presents for her follow-up diabetic visit. She has type 2 diabetes mellitus. Her disease course has been stable. There are no hypoglycemic associated symptoms. Pertinent negatives for diabetes include no blurred vision, no chest pain, no polydipsia, no polyphagia and no polyuria. There are no hypoglycemic complications. Risk factors for coronary artery disease include diabetes mellitus, dyslipidemia, hypertension and post-menopausal. When asked about current treatments, none were reported. She participates in exercise three times a week. An ACE inhibitor/angiotensin II receptor blocker is being taken.  Hypertension This is a chronic problem. The current episode started more than 1 year ago. The problem has been gradually improving since onset. The problem is controlled. Pertinent negatives include no blurred vision, chest pain, palpitations or shortness of breath. Risk factors for coronary artery disease include post-menopausal state and sedentary  lifestyle. Past treatments include diuretics. The current treatment provides moderate improvement. Compliance problems include exercise.      Past Medical History:  Diagnosis Date   Chronic uveitis of both eyes    Diabetes mellitus without complication (HCC)    GERD (gastroesophageal reflux disease)    no meds currently   Glaucoma (increased eye pressure)    Goiter    Hypertension      Family History  Problem Relation Age of Onset   Diabetes Mother    Hypertension Mother    Glaucoma Mother    Hypertension Father    Cancer Sister      Current Outpatient Medications:    amoxicillin-clavulanate (AUGMENTIN) 875-125 MG tablet, Take 1 tablet by mouth 2 (two) times daily., Disp: 20 tablet, Rfl: 0   Biotin (BIOTIN MAXIMUM STRENGTH) 10 MG TABS, Take 1 capsule by mouth daily., Disp: , Rfl:    Blood Glucose Monitoring Suppl (CONTOUR BLOOD GLUCOSE SYSTEM) w/Device KIT, USE ONCE DAILY TO CHECK BLOOD SUGARS., Disp: 1 kit, Rfl: 0   Blood Glucose Monitoring Suppl DEVI, May substitute to any manufacturer covered by patient's insurance., Disp: 1 each, Rfl: 0   brimonidine (ALPHAGAN) 0.2 % ophthalmic solution, 1 drop 3 (three) times daily. 1 drop tid right eye, bid left eye, Disp: , Rfl:    cetirizine (ZYRTEC) 10 MG tablet, Take 10 mg by mouth daily., Disp: , Rfl:    Cholecalciferol (VITAMIN D3) 25 MCG (1000 UT) CAPS, Take by mouth., Disp: , Rfl:    clobetasol (TEMOVATE) 0.05 % external solution, Apply 1 Application topically at bedtime., Disp: 50 mL, Rfl: 1   dapagliflozin  propanediol (FARXIGA) 10 MG TABS tablet, Take 1 tablet (10 mg total) by mouth daily., Disp: 90 tablet, Rfl: 2   dorzolamide-timolol (COSOPT) 22.3-6.8 MG/ML ophthalmic solution, Place 1 drop into both eyes 2 (two) times daily., Disp: , Rfl:    Folic Acid 5 MG CAPS, daily. , Disp: , Rfl:    methotrexate (RHEUMATREX) 2.5 MG tablet, 2.5 mg. 8 tabs weekly, Disp: , Rfl:    Multiple Vitamins-Minerals (CENTRUM SILVER 50+WOMEN) TABS,  Take by mouth., Disp: , Rfl:    Omega-3 Fatty Acids (FISH OIL PO), Take by mouth., Disp: , Rfl:    omeprazole (PRILOSEC) 20 MG capsule, Take by mouth., Disp: , Rfl:    ONETOUCH ULTRA test strip, 1 EACH BY IN VITRO ROUTE IN THE MORNING, AT NOON, AND AT BEDTIME., Disp: 100 strip, Rfl: 0   pravastatin (PRAVACHOL) 80 MG tablet, TAKE 1 TABLET BY MOUTH EVERY DAY, Disp: 90 tablet, Rfl: 1   Safety Seal Miscellaneous MISC, Apply 1 Application topically daily. Medication Name: AA Gel Apply directly to scalp every other day for 1 week, if tolerated well, use daily, Disp: 30 g, Rfl: 4   triamterene-hydrochlorothiazide (MAXZIDE-25) 37.5-25 MG tablet, TAKE 1 TABLET BY MOUTH EVERY DAY, Disp: 90 tablet, Rfl: 2   TURMERIC PO, Take 1 capsule by mouth daily., Disp: , Rfl:    benzonatate (TESSALON PERLES) 100 MG capsule, Take 1 capsule (100 mg total) by mouth 3 (three) times daily as needed for cough., Disp: 30 capsule, Rfl: 1   No Known Allergies   Review of Systems  Constitutional: Negative.   HENT:  Positive for congestion and postnasal drip.   Eyes:  Negative for blurred vision.  Respiratory:  Positive for cough. Negative for shortness of breath.   Cardiovascular: Negative.  Negative for chest pain and palpitations.  Gastrointestinal: Negative.   Endocrine: Negative for polydipsia, polyphagia and polyuria.  Neurological: Negative.   Psychiatric/Behavioral: Negative.       Today's Vitals   04/21/23 0920  BP: 110/78  Pulse: 73  Temp: 98.4 F (36.9 C)  SpO2: 98%  Weight: 177 lb 6.4 oz (80.5 kg)  Height: 5\' 3"  (1.6 m)   Body mass index is 31.42 kg/m.  Wt Readings from Last 3 Encounters:  04/21/23 177 lb 6.4 oz (80.5 kg)  12/14/22 180 lb 9.6 oz (81.9 kg)  12/01/22 180 lb (81.6 kg)    The 10-year ASCVD risk score (Arnett DK, et al., 2019) is: 26.3%   Values used to calculate the score:     Age: 35 years     Sex: Female     Is Non-Hispanic African American: Yes     Diabetic: Yes     Tobacco  smoker: Yes     Systolic Blood Pressure: 110 mmHg     Is BP treated: Yes     HDL Cholesterol: 48 mg/dL     Total Cholesterol: 173 mg/dL  Objective:  Physical Exam Vitals and nursing note reviewed.  Constitutional:      Appearance: Normal appearance. She is ill-appearing.  HENT:     Head: Normocephalic and atraumatic.     Comments: Hoarseness Maxillary tenderness to percussion    Right Ear: Tympanic membrane, ear canal and external ear normal. There is no impacted cerumen.     Left Ear: Tympanic membrane, ear canal and external ear normal. There is no impacted cerumen.     Mouth/Throat:     Pharynx: Posterior oropharyngeal erythema present. No oropharyngeal exudate.  Eyes:  Extraocular Movements: Extraocular movements intact.  Cardiovascular:     Rate and Rhythm: Normal rate and regular rhythm.     Heart sounds: Normal heart sounds.  Pulmonary:     Effort: Pulmonary effort is normal.     Breath sounds: Normal breath sounds.  Musculoskeletal:     Cervical back: Normal range of motion.  Skin:    General: Skin is warm.  Neurological:     General: No focal deficit present.     Mental Status: She is alert.  Psychiatric:        Mood and Affect: Mood normal.        Behavior: Behavior normal.         Assessment And Plan:  Dyslipidemia associated with type 2 diabetes mellitus (HCC) Assessment & Plan: Chronic, LDL goal is less than 70.  She will continue with pravastatin 80mg  daily. Regarding DM, she will continue with [place] 10mg  daily. . Encouraged to follow dietary recommendations. She will f/u in 3-4 months.   Orders: -     CMP14+EGFR -     Hemoglobin A1c -     Microalbumin / creatinine urine ratio  Essential hypertension, benign Assessment & Plan: Chronic, well controlled. She will continue with triamterene/hydrochlorothiazide 37.5/25mg  daily for now. Will consider starting ARB in the near future.    Acute non-recurrent maxillary sinusitis Assessment &  Plan: She is encouraged to avoid dairy during acute illness.  I will send rx Augmentin to take twice daily x 10 days. She is encouraged to take full course of abx.    Class 1 obesity due to excess calories with serious comorbidity and body mass index (BMI) of 31.0 to 31.9 in adult Assessment & Plan: She is encouraged to strive for BMI less than 30 to decrease cardiac risk. Advised to aim for at least 150 minutes of exercise per week.    Other orders -     Benzonatate; Take 1 capsule (100 mg total) by mouth 3 (three) times daily as needed for cough.  Dispense: 30 capsule; Refill: 1 -     Amoxicillin-Pot Clavulanate; Take 1 tablet by mouth 2 (two) times daily.  Dispense: 20 tablet; Refill: 0  She is encouraged to strive for BMI less than 30 to decrease cardiac risk. Advised to aim for at least 150 minutes of exercise per week.   Return cancel April appt with RS only, for 4 month bp & dm f/u. Marland Kitchen  Patient was given opportunity to ask questions. Patient verbalized understanding of the plan and was able to repeat key elements of the plan. All questions were answered to their satisfaction.    I, Gwynneth Aliment, MD, have reviewed all documentation for this visit. The documentation on 04/21/23 for the exam, diagnosis, procedures, and orders are all accurate and complete.   IF YOU HAVE BEEN REFERRED TO A SPECIALIST, IT MAY TAKE 1-2 WEEKS TO SCHEDULE/PROCESS THE REFERRAL. IF YOU HAVE NOT HEARD FROM US/SPECIALIST IN TWO WEEKS, PLEASE GIVE [place] A CALL AT [phone].

## 2023-04-21 NOTE — Assessment & Plan Note (Signed)
 She is encouraged to strive for BMI less than 30 to decrease cardiac risk. Advised to aim for at least 150 minutes of exercise per week.

## 2023-04-21 NOTE — Assessment & Plan Note (Signed)
Chronic, well controlled. She will continue with triamterene/hydrochlorothiazide 37.5/25mg  daily for now. Will consider starting ARB in the near future.

## 2023-04-21 NOTE — Assessment & Plan Note (Signed)
She is encouraged to avoid dairy during acute illness.  I will send rx Augmentin to take twice daily x 10 days. She is encouraged to take full course of abx.

## 2023-04-22 LAB — CMP14+EGFR
ALT: 20 [IU]/L (ref 0–32)
AST: 21 [IU]/L (ref 0–40)
Albumin: 4.2 g/dL (ref 3.9–4.9)
Alkaline Phosphatase: 80 [IU]/L (ref 44–121)
BUN/Creatinine Ratio: 12 (ref 12–28)
BUN: 10 mg/dL (ref 8–27)
Bilirubin Total: 0.4 mg/dL (ref 0.0–1.2)
CO2: 25 mmol/L (ref 20–29)
Calcium: 9.9 mg/dL (ref 8.7–10.3)
Chloride: 101 mmol/L (ref 96–106)
Creatinine, Ser: 0.84 mg/dL (ref 0.57–1.00)
Globulin, Total: 2.6 g/dL (ref 1.5–4.5)
Glucose: 95 mg/dL (ref 70–99)
Potassium: 3.7 mmol/L (ref 3.5–5.2)
Sodium: 141 mmol/L (ref 134–144)
Total Protein: 6.8 g/dL (ref 6.0–8.5)
eGFR: 77 mL/min/{1.73_m2} (ref >59)

## 2023-04-22 LAB — HEMOGLOBIN A1C
Est. average glucose Bld gHb Est-mCnc: 143 mg/dL
Hgb A1c MFr Bld: 6.6 % — ABNORMAL HIGH (ref 4.8–5.6)

## 2023-04-22 LAB — MICROALBUMIN / CREATININE URINE RATIO
Creatinine, Urine: 93 mg/dL
Microalb/Creat Ratio: 9 mg/g{creat} (ref 0–29)
Microalbumin, Urine: 8.1 ug/mL

## 2023-05-18 ENCOUNTER — Ambulatory Visit: Payer: Medicare Other | Admitting: Dermatology

## 2023-05-18 ENCOUNTER — Encounter: Payer: Self-pay | Admitting: Dermatology

## 2023-05-18 VITALS — BP 127/73 | HR 73

## 2023-05-18 DIAGNOSIS — L669 Cicatricial alopecia, unspecified: Secondary | ICD-10-CM

## 2023-05-18 DIAGNOSIS — L658 Other specified nonscarring hair loss: Secondary | ICD-10-CM

## 2023-05-18 DIAGNOSIS — L6681 Central centrifugal cicatricial alopecia: Secondary | ICD-10-CM

## 2023-05-18 NOTE — Progress Notes (Unsigned)
   Follow-Up Visit   Subjective  Christina Anthony is a 68 y.o. female who presents for the following: Traction alopecia and CCCA  Patient present today for follow up visit for Traction alopecia and CCCA. Patient was last evaluated on 01/18/23. At this visit patient was recommended Continuing using AA Gel from [facility], Continue using the OTC Viviscal and Vital Protein supplements, Clobetasol 0.05% solution to use topically nightly on affected areas. She is not able to use medications every night due to her hair style. She questions if maybe she should change her hair style. She denies medication changes.  The following portions of the chart were reviewed this encounter and updated as appropriate: medications, allergies, medical history  Review of Systems:  No other skin or systemic complaints except as noted in HPI or Assessment and Plan.  Objective  Well appearing patient in no apparent distress; mood and affect are within normal limits.  A focused examination was performed of the following areas: Scalp  Relevant exam findings are noted in the Assessment and Plan.              Assessment & Plan   CCCA  Treatment Plan:  - Continue using AA Gel from [facility] - Continue using the OTC Viviscal and Vital Protein supplements - Rx Clobetasol 0.05% solution to use topically nightly on affected areas   TRACTION ALOPECIA  Treatment Plan: Advised patient her current hair style is a good style. Just reminded her to avoid a tight braid.    Return in about 6 months (around 11/18/2023) for Alopecia.  Documentation: I have reviewed the above documentation for accuracy and completeness, and I agree with the above.  I, Joanie Coddington, CMA, am acting as scribe for Langston Reusing, DO .   Langston Reusing, DO

## 2023-05-18 NOTE — Patient Instructions (Signed)

## 2023-06-30 ENCOUNTER — Ambulatory Visit: Payer: Medicare Other

## 2023-06-30 ENCOUNTER — Ambulatory Visit: Payer: Medicare Other | Admitting: Internal Medicine

## 2023-07-09 ENCOUNTER — Other Ambulatory Visit: Payer: Self-pay | Admitting: Internal Medicine

## 2023-07-31 DIAGNOSIS — I129 Hypertensive chronic kidney disease with stage 1 through stage 4 chronic kidney disease, or unspecified chronic kidney disease: Secondary | ICD-10-CM | POA: Diagnosis not present

## 2023-07-31 DIAGNOSIS — Z809 Family history of malignant neoplasm, unspecified: Secondary | ICD-10-CM | POA: Diagnosis not present

## 2023-07-31 DIAGNOSIS — E785 Hyperlipidemia, unspecified: Secondary | ICD-10-CM | POA: Diagnosis not present

## 2023-07-31 DIAGNOSIS — N182 Chronic kidney disease, stage 2 (mild): Secondary | ICD-10-CM | POA: Diagnosis not present

## 2023-07-31 DIAGNOSIS — E11319 Type 2 diabetes mellitus with unspecified diabetic retinopathy without macular edema: Secondary | ICD-10-CM | POA: Diagnosis not present

## 2023-07-31 DIAGNOSIS — E1122 Type 2 diabetes mellitus with diabetic chronic kidney disease: Secondary | ICD-10-CM | POA: Diagnosis not present

## 2023-07-31 DIAGNOSIS — E669 Obesity, unspecified: Secondary | ICD-10-CM | POA: Diagnosis not present

## 2023-07-31 DIAGNOSIS — Z8249 Family history of ischemic heart disease and other diseases of the circulatory system: Secondary | ICD-10-CM | POA: Diagnosis not present

## 2023-07-31 DIAGNOSIS — Z833 Family history of diabetes mellitus: Secondary | ICD-10-CM | POA: Diagnosis not present

## 2023-08-02 ENCOUNTER — Other Ambulatory Visit: Payer: Self-pay | Admitting: Internal Medicine

## 2023-08-24 ENCOUNTER — Ambulatory Visit: Payer: Medicare Other | Admitting: Internal Medicine

## 2023-08-24 ENCOUNTER — Telehealth: Payer: Self-pay

## 2023-08-24 ENCOUNTER — Encounter: Payer: Self-pay | Admitting: Internal Medicine

## 2023-08-24 VITALS — BP 120/84 | HR 70 | Temp 98.4°F | Ht 63.0 in | Wt 179.0 lb

## 2023-08-24 DIAGNOSIS — Z6831 Body mass index (BMI) 31.0-31.9, adult: Secondary | ICD-10-CM

## 2023-08-24 DIAGNOSIS — E66811 Obesity, class 1: Secondary | ICD-10-CM

## 2023-08-24 DIAGNOSIS — E785 Hyperlipidemia, unspecified: Secondary | ICD-10-CM

## 2023-08-24 DIAGNOSIS — E6609 Other obesity due to excess calories: Secondary | ICD-10-CM

## 2023-08-24 DIAGNOSIS — E1169 Type 2 diabetes mellitus with other specified complication: Secondary | ICD-10-CM

## 2023-08-24 DIAGNOSIS — E042 Nontoxic multinodular goiter: Secondary | ICD-10-CM | POA: Diagnosis not present

## 2023-08-24 DIAGNOSIS — I10 Essential (primary) hypertension: Secondary | ICD-10-CM | POA: Diagnosis not present

## 2023-08-24 NOTE — Progress Notes (Unsigned)
 I,Christina Anthony, CMA,acting as a Neurosurgeon for Christina LOISE Slocumb, MD.,have documented all relevant documentation on the behalf of Christina LOISE Slocumb, MD,as directed by  Christina LOISE Slocumb, MD while in the presence of Christina LOISE Slocumb, MD.  Subjective:  Patient ID: Christina Anthony , female    DOB: 1955/11/13 , 68 y.o.   MRN: [id number]  Chief Complaint  Patient presents with  . Hypertension    Patient presents today for diabetes and BP check. She reports compliance with medications. Denies headache, chest pain & sob.   . Diabetes    HPI Discussed the use of AI scribe software for clinical note transcription with the patient, who gave verbal consent to proceed.  History of Present Illness Christina Anthony is a 68 year old female who presents for a diabetes check.  She regularly monitors her blood sugar levels, with recent readings of 123 mg/dL yesterday morning and 856 mg/dL today, which she attributes to consuming two lemon bars. She maintains an exercise routine at the [facility], walking three miles and using the stationary bike for three miles, three times a week.  She is under regular care for glaucoma and has an upcoming eye exam on Monday for new glasses. She plans to request a diabetic eye exam during this appointment.  Her current medications include Maxzide daily, pravastatin  daily, omeprazole 20 mg, a multivitamin, methotrexate once a week, and Farxiga  daily. She no longer uses clobetasol  cream but continues to use a liquid prescribed by her dermatologist for her scalp. She is not on any thyroid  medication.  She mentions a significant increase in the cost of Farxiga  since switching from [employer] to [facility], now paying $64 a month compared to $13 previously.  She has a history of hematuria, for which an ultrasound of her kidneys and bladder was performed, showing no abnormalities. She also has thyroid  nodules, last evaluated by ultrasound in 2023.         Diabetes She presents for her follow-up diabetic visit. She has type 2 diabetes mellitus. Her disease course has been stable. There are no hypoglycemic associated symptoms. Pertinent negatives for diabetes include no blurred vision, no chest pain, no polydipsia, no polyphagia and no polyuria. There are no hypoglycemic complications. Risk factors for coronary artery disease include diabetes mellitus, dyslipidemia, hypertension and post-menopausal. When asked about current treatments, none were reported. She participates in exercise three times a week. An ACE inhibitor/angiotensin II receptor blocker is being taken.  Hypertension This is a chronic problem. The current episode started more than 1 year ago. The problem has been gradually improving since onset. The problem is controlled. Pertinent negatives include no blurred vision, chest pain, palpitations or shortness of breath. Risk factors for coronary artery disease include post-menopausal state and sedentary lifestyle. Past treatments include diuretics. The current treatment provides moderate improvement. Compliance problems include exercise.      Past Medical History:  Diagnosis Date  . Chronic uveitis of both eyes   . Diabetes mellitus without complication (HCC)   . GERD (gastroesophageal reflux disease)    no meds currently  . Glaucoma (increased eye pressure)   . Goiter   . Hypertension      Family History  Problem Relation Age of Onset  . Diabetes Mother   . Hypertension Mother   . Glaucoma Mother   . Hypertension Father   . Cancer Sister      Current Outpatient Medications:  .  Biotin (BIOTIN MAXIMUM STRENGTH) 10 MG TABS,  Take 1 capsule by mouth daily., Disp: , Rfl:  .  Blood Glucose Monitoring Suppl (CONTOUR BLOOD GLUCOSE SYSTEM) w/Device KIT, USE ONCE DAILY TO CHECK BLOOD SUGARS., Disp: 1 kit, Rfl: 0 .  Blood Glucose Monitoring Suppl DEVI, May substitute to any manufacturer covered by patient's insurance., Disp: 1  each, Rfl: 0 .  brimonidine (ALPHAGAN) 0.2 % ophthalmic solution, 1 drop 3 (three) times daily. 1 drop tid right eye, bid left eye, Disp: , Rfl:  .  cetirizine (ZYRTEC) 10 MG tablet, Take 10 mg by mouth daily., Disp: , Rfl:  .  Cholecalciferol (VITAMIN D3) 25 MCG (1000 UT) CAPS, Take by mouth., Disp: , Rfl:  .  dorzolamide-timolol (COSOPT) 22.3-6.8 MG/ML ophthalmic solution, Place 1 drop into both eyes 2 (two) times daily., Disp: , Rfl:  .  FARXIGA  10 MG TABS tablet, TAKE 1 TABLET BY MOUTH EVERY DAY, Disp: 90 tablet, Rfl: 1 .  Folic Acid 5 MG CAPS, daily. , Disp: , Rfl:  .  methotrexate (RHEUMATREX) 2.5 MG tablet, 2.5 mg. 8 tabs weekly, Disp: , Rfl:  .  Multiple Vitamins-Minerals (CENTRUM SILVER 50+WOMEN) TABS, Take by mouth., Disp: , Rfl:  .  Omega-3 Fatty Acids (FISH OIL PO), Take by mouth., Disp: , Rfl:  .  omeprazole (PRILOSEC) 20 MG capsule, Take by mouth., Disp: , Rfl:  .  ONETOUCH ULTRA test strip, 1 EACH BY IN VITRO ROUTE IN THE MORNING, AT NOON, AND AT BEDTIME., Disp: 100 strip, Rfl: 0 .  pravastatin  (PRAVACHOL ) 80 MG tablet, TAKE 1 TABLET BY MOUTH EVERY DAY, Disp: 90 tablet, Rfl: 1 .  Safety Seal Miscellaneous MISC, Apply 1 Application topically daily. Medication Name: AA Gel Apply directly to scalp every other day for 1 week, if tolerated well, use daily, Disp: 30 g, Rfl: 4 .  triamterene -hydrochlorothiazide (MAXZIDE-25) 37.5-25 MG tablet, TAKE 1 TABLET BY MOUTH EVERY DAY, Disp: 90 tablet, Rfl: 2 .  TURMERIC PO, Take 1 capsule by mouth daily., Disp: , Rfl:  .  benzonatate  (TESSALON  PERLES) 100 MG capsule, Take 1 capsule (100 mg total) by mouth 3 (three) times daily as needed for cough., Disp: 30 capsule, Rfl: 1 .  clobetasol  (TEMOVATE ) 0.05 % external solution, Apply 1 Application topically at bedtime. (Patient not taking: Reported on 08/24/2023), Disp: 50 mL, Rfl: 1   No Known Allergies   Review of Systems  Constitutional: Negative.   Eyes: Negative.  Negative for blurred vision.   Respiratory: Negative.  Negative for shortness of breath.   Cardiovascular: Negative.  Negative for chest pain and palpitations.  Gastrointestinal: Negative.   Endocrine: Negative for polydipsia, polyphagia and polyuria.  Musculoskeletal: Negative.   Skin: Negative.   Neurological: Negative.   Psychiatric/Behavioral: Negative.    All other systems reviewed and are negative.    Today's Vitals   08/24/23 1058  BP: 120/84  Pulse: 70  Temp: 98.4 F (36.9 C)  TempSrc: Oral  Weight: 179 lb (81.2 kg)  Height: 5' 3 (1.6 m)  PainSc: 0-No pain   Body mass index is 31.71 kg/m.  Wt Readings from Last 3 Encounters:  08/24/23 179 lb (81.2 kg)  04/21/23 177 lb 6.4 oz (80.5 kg)  12/14/22 180 lb 9.6 oz (81.9 kg)    The 10-year ASCVD risk score (Arnett DK, et al., 2019) is: 31.2%   Values used to calculate the score:     Age: 53 years     Clincally relevant sex: Female     Is Non-Hispanic African American: Yes  Diabetic: Yes     Tobacco smoker: Yes     Systolic Blood Pressure: 120 mmHg     Is BP treated: Yes     HDL Cholesterol: 48 mg/dL     Total Cholesterol: 173 mg/dL  Objective:  Physical Exam Vitals and nursing note reviewed.  Constitutional:      Appearance: Normal appearance.  HENT:     Head: Normocephalic and atraumatic.   Eyes:     Extraocular Movements: Extraocular movements intact.    Cardiovascular:     Rate and Rhythm: Normal rate and regular rhythm.     Heart sounds: Normal heart sounds.  Pulmonary:     Effort: Pulmonary effort is normal.     Breath sounds: Normal breath sounds.   Musculoskeletal:     Cervical back: Normal range of motion.   Skin:    General: Skin is warm.   Neurological:     General: No focal deficit present.     Mental Status: She is alert.   Psychiatric:        Mood and Affect: Mood normal.        Behavior: Behavior normal.     Assessment And Plan:  Dyslipidemia associated with type 2 diabetes mellitus (HCC) -     CBC -      CMP14+EGFR -     Hemoglobin A1c -     Lipid panel -     Lipoprotein A (LPA)  Essential hypertension, benign -     CMP14+EGFR -     Lipid panel  Multinodular goiter -     TSH + free T4 -     Ambulatory referral to Endocrinology -     US  THYROID ; Future  Class 1 obesity due to excess calories with serious comorbidity and body mass index (BMI) of 31.0 to 31.9 in adult  Assessment and Plan Assessment & Plan Type 2 Diabetes Mellitus Blood glucose levels controlled. Exercise routine lacks strength training. Discussed Farxiga  cost and provided samples. - Incorporate strength training at least two days a week. - Discuss with ophthalmologist for diabetic eye exams. - Provide Farxiga  samples. - Consider patient assistance programs for Farxiga .  Glaucoma Under regular ophthalmologic care. Needs confirmation of diabetic eye exams. - Confirm with ophthalmologist that diabetic eye exams are being performed.  Thyroid  Nodules Nodules stable, no dysphagia or size increase. Discussed medication and endocrinology consultation. Ultrasound planned for updated imaging. - Schedule thyroid  ultrasound around August 1st. - Refer to endocrinologist for evaluation.  Hematuria Discussed potential kidney stone if hematuria persists. - Consider CT scan if hematuria persists.  Hyperlipidemia On pravastatin . Plans to assess lipoprotein levels for genetic predisposition. Cardiac calcium score considered if elevated. - Check lipoprotein levels. - Consider cardiac calcium score if lipoprotein levels are elevated.  General Health Maintenance Missed Medicare wellness visit, rescheduled. Emphasized importance despite recent nurse visit. Physical due in October. - Schedule Medicare wellness visit for the end of the week. - Schedule physical in October.  Follow-up Follow-up phone call scheduled for Medicare wellness visit. Provided Farxiga  samples due to insurance cost issues. - Follow up with a phone  call on Friday, June 27th for Medicare wellness visit.    Return for controlled DM check-4 months.  Patient was given opportunity to ask questions. Patient verbalized understanding of the plan and was able to repeat key elements of the plan. All questions were answered to their satisfaction.    I, Christina LOISE Slocumb, MD, have reviewed all documentation for this visit.  The documentation on 08/24/23 for the exam, diagnosis, procedures, and orders are all accurate and complete.   IF YOU HAVE BEEN REFERRED TO A SPECIALIST, IT MAY TAKE 1-2 WEEKS TO SCHEDULE/PROCESS THE REFERRAL. IF YOU HAVE NOT HEARD FROM US /SPECIALIST IN TWO WEEKS, PLEASE GIVE US  A CALL AT [phone].

## 2023-08-24 NOTE — Patient Instructions (Signed)

## 2023-08-24 NOTE — Telephone Encounter (Signed)
 I attempted a PA for Fraxiga 10mg  for pt but she does not need one. I called the pharmacy and they stated she may have high copay for her med due to her not meeting her deductible. Pt was advised to reach out to her insurance company. YL,RMA

## 2023-08-25 ENCOUNTER — Encounter: Payer: Self-pay | Admitting: Internal Medicine

## 2023-08-25 ENCOUNTER — Ambulatory Visit: Payer: Self-pay | Admitting: Internal Medicine

## 2023-08-25 DIAGNOSIS — E042 Nontoxic multinodular goiter: Secondary | ICD-10-CM | POA: Insufficient documentation

## 2023-08-25 LAB — LIPID PANEL
Chol/HDL Ratio: 4.1 ratio (ref 0.0–4.4)
Cholesterol, Total: 203 mg/dL — ABNORMAL HIGH (ref 100–199)
HDL: 50 mg/dL (ref >39)
LDL Chol Calc (NIH): 132 mg/dL — ABNORMAL HIGH (ref 0–99)
Triglycerides: 118 mg/dL (ref 0–149)
VLDL Cholesterol Cal: 21 mg/dL (ref 5–40)

## 2023-08-25 LAB — CMP14+EGFR
ALT: 19 IU/L (ref 0–32)
AST: 18 IU/L (ref 0–40)
Albumin: 4.4 g/dL (ref 3.9–4.9)
Alkaline Phosphatase: 80 IU/L (ref 44–121)
BUN/Creatinine Ratio: 20 (ref 12–28)
BUN: 16 mg/dL (ref 8–27)
Bilirubin Total: 0.2 mg/dL (ref 0.0–1.2)
CO2: 24 mmol/L (ref 20–29)
Calcium: 10.4 mg/dL — ABNORMAL HIGH (ref 8.7–10.3)
Chloride: 100 mmol/L (ref 96–106)
Creatinine, Ser: 0.82 mg/dL (ref 0.57–1.00)
Globulin, Total: 2.5 g/dL (ref 1.5–4.5)
Glucose: 81 mg/dL (ref 70–99)
Potassium: 3.7 mmol/L (ref 3.5–5.2)
Sodium: 142 mmol/L (ref 134–144)
Total Protein: 6.9 g/dL (ref 6.0–8.5)
eGFR: 79 mL/min/{1.73_m2} (ref >59)

## 2023-08-25 LAB — CBC
Hematocrit: 44.9 % (ref 34.0–46.6)
Hemoglobin: 14.6 g/dL (ref 11.1–15.9)
MCH: 30.1 pg (ref 26.6–33.0)
MCHC: 32.5 g/dL (ref 31.5–35.7)
MCV: 93 fL (ref 79–97)
Platelets: 304 10*3/uL (ref 150–450)
RBC: 4.85 x10E6/uL (ref 3.77–5.28)
RDW: 13.8 % (ref 11.7–15.4)
WBC: 8.4 10*3/uL (ref 3.4–10.8)

## 2023-08-25 LAB — TSH+FREE T4
Free T4: 1.22 ng/dL (ref 0.82–1.77)
TSH: 0.368 u[IU]/mL — ABNORMAL LOW (ref 0.450–4.500)

## 2023-08-25 LAB — HEMOGLOBIN A1C
Est. average glucose Bld gHb Est-mCnc: 126 mg/dL
Hgb A1c MFr Bld: 6 % — ABNORMAL HIGH (ref 4.8–5.6)

## 2023-08-25 LAB — LIPOPROTEIN A (LPA): Lipoprotein (a): 114.7 nmol/L — ABNORMAL HIGH (ref <75.0)

## 2023-08-25 MED ORDER — ROSUVASTATIN CALCIUM 10 MG PO TABS: 10.0000 mg | ORAL_TABLET | Freq: Every day | ORAL | 11 refills | Status: DC

## 2023-08-25 NOTE — Assessment & Plan Note (Addendum)
 Chronic, well controlled. She will continue with triamterene /hydrochlorothiazide 37.5/25mg  daily for now.  - Consider ARB use in the future - Farxiga  is providing renal protection at this time

## 2023-08-25 NOTE — Assessment & Plan Note (Signed)
 Nodules stable, no dysphagia or size increase. Discussed medication and endocrinology consultation. Ultrasound planned for updated imaging. - Schedule thyroid  ultrasound around August 1st. - Refer to endocrinologist for evaluation.

## 2023-08-25 NOTE — Assessment & Plan Note (Addendum)
 Chronic, LDL goal is less than 70.  On pravastatin . Plans to assess lipoprotein levels for genetic predisposition. Cardiac calcium score considered if elevated. - Check lipoprotein levels. - Consider cardiac calcium score if lipoprotein levels are elevated.She will continue with pravastatin  80mg  daily.   Regarding DM, she will continue with Farxiga  10mg  daily. . Encouraged to follow dietary recommendations. She will f/u in 3-4 months. Blood glucose levels controlled. Exercise routine lacks strength training. Discussed Farxiga  cost and provided samples. - Incorporate strength training at least two days a week. - Keep upcoming appt with Ophthalmologist - Provide Farxiga  samples. - Consider patient assistance programs for Farxiga .

## 2023-08-25 NOTE — Assessment & Plan Note (Signed)
 She is encouraged to strive for BMI less than 30 to decrease cardiac risk. Advised to aim for at least 150 minutes of exercise per week.

## 2023-08-30 DIAGNOSIS — H52223 Regular astigmatism, bilateral: Secondary | ICD-10-CM | POA: Diagnosis not present

## 2023-08-30 DIAGNOSIS — E119 Type 2 diabetes mellitus without complications: Secondary | ICD-10-CM | POA: Diagnosis not present

## 2023-08-30 DIAGNOSIS — H524 Presbyopia: Secondary | ICD-10-CM | POA: Diagnosis not present

## 2023-08-30 DIAGNOSIS — Z961 Presence of intraocular lens: Secondary | ICD-10-CM | POA: Diagnosis not present

## 2023-08-30 DIAGNOSIS — H2013 Chronic iridocyclitis, bilateral: Secondary | ICD-10-CM | POA: Diagnosis not present

## 2023-08-30 DIAGNOSIS — H401133 Primary open-angle glaucoma, bilateral, severe stage: Secondary | ICD-10-CM | POA: Diagnosis not present

## 2023-09-07 ENCOUNTER — Ambulatory Visit
Admission: RE | Admit: 2023-09-07 | Discharge: 2023-09-07 | Disposition: A | Discharge status: Home / Self Care | Source: Ambulatory Visit | Attending: Internal Medicine | Admitting: Internal Medicine

## 2023-09-07 DIAGNOSIS — E042 Nontoxic multinodular goiter: Secondary | ICD-10-CM

## 2023-09-08 ENCOUNTER — Ambulatory Visit (INDEPENDENT_AMBULATORY_CARE_PROVIDER_SITE_OTHER)

## 2023-09-08 DIAGNOSIS — Z0000 Encounter for general adult medical examination without abnormal findings: Secondary | ICD-10-CM | POA: Diagnosis not present

## 2023-09-08 NOTE — Progress Notes (Signed)
 Subjective:   Christina Anthony is a 68 y.o. female who presents for Medicare Annual (Subsequent) preventive examination.  Visit Complete: Virtual I connected with  Christina Anthony on 09/08/23 by a audio enabled telemedicine application and verified that I am speaking with the correct person using two identifiers.  Patient Location: Home  Provider Location: Office/Clinic  I discussed the limitations of evaluation and management by telemedicine. The patient expressed understanding and agreed to proceed.  Vital Signs: Because this visit was a virtual/telehealth visit, some criteria may be missing or patient reported. Any vitals not documented were not able to be obtained and vitals that have been documented are patient reported.  Patient Medicare AWV questionnaire was completed by the patient on 09/08/23; I have confirmed that all information answered by patient is correct and no changes since this date.        Objective:    There were no vitals filed for this visit. There is no height or weight on file to calculate BMI.     06/25/2022    3:31 PM 01/07/2021    9:24 AM 11/28/2010   11:32 AM  Advanced Directives  Does Patient Have a Medical Advance Directive? No No Patient does not have advance directive   Would patient like information on creating a medical advance directive? No - Patient declined No - Patient declined      Data saved with a previous flowsheet row definition    Current Medications (verified) Outpatient Encounter Medications as of 09/08/2023  Medication Sig   benzonatate  (TESSALON  PERLES) 100 MG capsule Take 1 capsule (100 mg total) by mouth 3 (three) times daily as needed for cough.   Biotin (BIOTIN MAXIMUM STRENGTH) 10 MG TABS Take 1 capsule by mouth daily.   Blood Glucose Monitoring Suppl (CONTOUR BLOOD GLUCOSE SYSTEM) w/Device KIT USE ONCE DAILY TO CHECK BLOOD SUGARS.   Blood Glucose Monitoring Suppl DEVI May substitute to any manufacturer covered by  patient's insurance.   brimonidine (ALPHAGAN) 0.2 % ophthalmic solution 1 drop 3 (three) times daily. 1 drop tid right eye, bid left eye   cetirizine (ZYRTEC) 10 MG tablet Take 10 mg by mouth daily.   Cholecalciferol (VITAMIN D3) 25 MCG (1000 UT) CAPS Take by mouth.   clobetasol  (TEMOVATE ) 0.05 % external solution Apply 1 Application topically at bedtime. (Patient not taking: Reported on 08/24/2023)   dorzolamide-timolol (COSOPT) 22.3-6.8 MG/ML ophthalmic solution Place 1 drop into both eyes 2 (two) times daily.   FARXIGA  10 MG TABS tablet TAKE 1 TABLET BY MOUTH EVERY DAY   Folic Acid 5 MG CAPS daily.    methotrexate (RHEUMATREX) 2.5 MG tablet 2.5 mg. 8 tabs weekly   Multiple Vitamins-Minerals (CENTRUM SILVER 50+WOMEN) TABS Take by mouth.   Omega-3 Fatty Acids (FISH OIL PO) Take by mouth.   omeprazole (PRILOSEC) 20 MG capsule Take by mouth.   ONETOUCH ULTRA test strip 1 EACH BY IN VITRO ROUTE IN THE MORNING, AT NOON, AND AT BEDTIME.   rosuvastatin  (CRESTOR ) 10 MG tablet Take 1 tablet (10 mg total) by mouth daily.   Safety Seal Miscellaneous MISC Apply 1 Application topically daily. Medication Name: AA Gel Apply directly to scalp every other day for 1 week, if tolerated well, use daily   triamterene -hydrochlorothiazide (MAXZIDE-25) 37.5-25 MG tablet TAKE 1 TABLET BY MOUTH EVERY DAY   TURMERIC PO Take 1 capsule by mouth daily.   No facility-administered encounter medications on file as of 09/08/2023.    Allergies (verified) Patient has no  known allergies.   History: Past Medical History:  Diagnosis Date   Chronic uveitis of both eyes    Diabetes mellitus without complication (HCC)    GERD (gastroesophageal reflux disease)    no meds currently   Glaucoma (increased eye pressure)    Goiter    Hypertension    Past Surgical History:  Procedure Laterality Date   COLONOSCOPY     NO PAST SURGERIES     UPPER GASTROINTESTINAL ENDOSCOPY     Family History  Problem Relation Age of Onset    Diabetes Mother    Hypertension Mother    Glaucoma Mother    Hypertension Father    Cancer Sister    Social History   Socioeconomic History   Marital status: Married    Spouse name: Not on file   Number of children: Not on file   Years of education: Not on file   Highest education level: Not on file  Occupational History   Not on file  Tobacco Use   Smoking status: Every Day    Current packs/day: 0.25    Average packs/day: 0.3 packs/day for 48.0 years (12.0 ttl pk-yrs)    Types: Cigarettes   Smokeless tobacco: Never   Tobacco comments:    decrease number of cigs smoked per day  Vaping Use   Vaping status: Never Used  Substance and Sexual Activity   Alcohol use: Yes    Alcohol/week: 1.0 standard drink of alcohol    Types: 1 Cans of beer per week    Comment: daily   Drug use: No   Sexual activity: Not on file  Other Topics Concern   Not on file  Social History Narrative   Not on file   Social Drivers of Health   Financial Resource Strain: Low Risk  (06/25/2022)   Overall Financial Resource Strain (CARDIA)    Difficulty of Paying Living Expenses: Not hard at all  Food Insecurity: No Food Insecurity (06/25/2022)   Hunger Vital Sign    Worried About Running Out of Food in the Last Year: Never true    Ran Out of Food in the Last Year: Never true  Transportation Needs: No Transportation Needs (06/25/2022)   PRAPARE - Administrator, Civil Service (Medical): No    Lack of Transportation (Non-Medical): No  Physical Activity: Sufficiently Active (06/25/2022)   Exercise Vital Sign    Days of Exercise per Week: 3 days    Minutes of Exercise per Session: 60 min  Stress: No Stress Concern Present (06/25/2022)   [employer] of Occupational Health - Occupational Stress Questionnaire    Feeling of Stress : Not at all  Social Connections: Not on file    Tobacco Counseling Ready to quit: Not Answered Counseling given: Not Answered Tobacco comments: decrease  number of cigs smoked per day   Clinical Intake:                        Activities of Daily Living     No data to display          Patient Care Team: Jarold Medici, MD as PCP - General (Internal Medicine) Rutherford Gain, MD as Consulting Physician (Obstetrics and Gynecology) [place], Reyes Mcardle, MD as Referring Physician (Ophthalmology)  Indicate any recent Medical Services you may have received from other than [facility] providers in the past year (date may be approximate).     Assessment:   This is a routine wellness examination  for Christina Anthony.  Hearing/Vision screen No results found.   Goals Addressed   None    Depression Screen    08/24/2023   11:02 AM 04/21/2023    9:22 AM 12/14/2022    9:00 AM 08/11/2022    2:51 PM 06/25/2022    3:34 PM 06/25/2022    3:32 PM 05/07/2022   11:07 AM  PHQ 2/9 Scores  PHQ - 2 Score 0 0 0 0 0 0 0  PHQ- 9 Score 0 0 0 0 0      Fall Risk    08/24/2023   11:02 AM 04/21/2023    9:22 AM 12/14/2022    8:58 AM 08/11/2022    2:51 PM 06/25/2022    3:34 PM  Fall Risk   Falls in the past year? 0 0 0 0 0  Number falls in past yr: 0 0 0 0 0  Injury with Fall? 0 0 0 0 0  Risk for fall due to : No Fall Risks No Fall Risks No Fall Risks No Fall Risks No Fall Risks  Follow up Falls evaluation completed Falls evaluation completed Falls evaluation completed Falls evaluation completed Falls evaluation completed    MEDICARE RISK AT HOME:    TIMED UP AND GO:  Was the test performed?  No    Cognitive Function:        06/25/2022    3:33 PM  6CIT Screen  What Year? 0 points  What month? 0 points  What time? 0 points  Count back from 20 0 points  Months in reverse 2 points  Repeat phrase 2 points  Total Score 4 points    Immunizations Immunization History  Administered Date(s) Administered   Fluad Quad(high Dose 65+) 12/11/2021   Fluad Trivalent(High Dose 65+) 12/01/2022   Influenza Inj Mdck Quad Pf 12/14/2017    Influenza,inj,Quad PF,6+ Mos 11/10/2018, 12/19/2019, 11/18/2020   PFIZER(Purple Top)SARS-COV-2 Vaccination 04/24/2019, 05/15/2019, 12/08/2019   PNEUMOCOCCAL CONJUGATE-20 12/17/2021   Pfizer(Comirnaty)Fall Seasonal Vaccine 12 years and older 12/14/2022   Tdap 03/23/2016   Unspecified SARS-COV-2 Vaccination 04/17/2022   Zoster Recombinant(Shingrix ) 10/01/2021, 02/19/2022    TDAP status: Up to date  Flu Vaccine status: Up to date  Pneumococcal vaccine status: Up to date  Covid-19 vaccine status: Information provided on how to obtain vaccines.   Qualifies for Shingles Vaccine? Yes   Zostavax completed Yes   Shingrix  Completed?: Yes  Screening Tests Health Maintenance  Topic Date Due   OPHTHALMOLOGY EXAM  Never done   COVID-19 Vaccine (6 - Pfizer risk 2024-25 season) 06/14/2023   Medicare Annual Wellness (AWV)  06/25/2023   INFLUENZA VACCINE  10/01/2023   FOOT EXAM  12/14/2023   HEMOGLOBIN A1C  02/23/2024   Diabetic kidney evaluation - Urine ACR  04/20/2024   Diabetic kidney evaluation - eGFR measurement  08/23/2024   MAMMOGRAM  01/14/2025   DTaP/Tdap/Td (2 - Td or Tdap) 03/23/2026   Colonoscopy  07/07/2032   Pneumococcal Vaccine: 50+ Years  Completed   DEXA SCAN  Completed   Hepatitis C Screening  Completed   Zoster Vaccines- Shingrix   Completed   Hepatitis B Vaccines  Aged Out   HPV VACCINES  Aged Out   Meningococcal B Vaccine  Aged Out    Health Maintenance  Health Maintenance Due  Topic Date Due   OPHTHALMOLOGY EXAM  Never done   COVID-19 Vaccine (6 - Pfizer risk 2024-25 season) 06/14/2023   Medicare Annual Wellness (AWV)  06/25/2023    Colorectal cancer screening:  Type of screening: Colonoscopy. Completed 07/08/22. Repeat every 10 years  Mammogram status: Completed 01/15/23. Repeat every year  Bone Density status: Completed 10/21/17. Results reflect: Bone density results: OSTEOPENIA. Repeat every 2 years.  Lung Cancer Screening: (Low Dose CT Chest recommended  if Age 72-80 years, 20 pack-year currently smoking OR have quit w/in 15years.) does qualify.   Lung Cancer Screening Referral:   Additional Screening:  Hepatitis C Screening: does qualify; Completed 06/27/15  Vision Screening: Recommended annual ophthalmology exams for early detection of glaucoma and other disorders of the eye. Is the patient up to date with their annual eye exam?  Yes  Who is the provider or what is the name of the office in which the patient attends annual eye exams? [facility]. If pt is not established with a provider, would they like to be referred to a provider to establish care? No .   Dental Screening: Recommended annual dental exams for proper oral hygiene  Diabetic Foot Exam: Diabetic Foot Exam: Completed 12/14/22  Community Resource Referral / Chronic Care Management: CRR required this visit?  No   CCM required this visit?  No     Plan:     I have personally reviewed and noted the following in the patient's chart:   Medical and social history Use of alcohol, tobacco or illicit drugs  Current medications and supplements including opioid prescriptions. Patient is not currently taking opioid prescriptions. Functional ability and status Nutritional status Physical activity Advanced directives List of other physicians Hospitalizations, surgeries, and ER visits in previous 12 months Vitals Screenings to include cognitive, depression, and falls Referrals and appointments  In addition, I have reviewed and discussed with patient certain preventive protocols, quality metrics, and best practice recommendations. A written personalized care plan for preventive services as well as general preventive health recommendations were provided to patient.     Kristeen JINNY Lunger, CMA   09/08/2023   After Visit Summary: (MyChart) Due to this being a telephonic visit, the after visit summary with patients personalized plan was offered to patient via MyChart   Nurse Notes:

## 2023-09-10 ENCOUNTER — Ambulatory Visit

## 2023-09-28 DIAGNOSIS — E119 Type 2 diabetes mellitus without complications: Secondary | ICD-10-CM | POA: Diagnosis not present

## 2023-09-28 DIAGNOSIS — H35033 Hypertensive retinopathy, bilateral: Secondary | ICD-10-CM | POA: Diagnosis not present

## 2023-09-28 DIAGNOSIS — H401122 Primary open-angle glaucoma, left eye, moderate stage: Secondary | ICD-10-CM | POA: Diagnosis not present

## 2023-09-28 DIAGNOSIS — H2013 Chronic iridocyclitis, bilateral: Secondary | ICD-10-CM | POA: Diagnosis not present

## 2023-09-28 DIAGNOSIS — Z79899 Other long term (current) drug therapy: Secondary | ICD-10-CM | POA: Diagnosis not present

## 2023-09-28 DIAGNOSIS — H35371 Puckering of macula, right eye: Secondary | ICD-10-CM | POA: Diagnosis not present

## 2023-09-28 DIAGNOSIS — H401113 Primary open-angle glaucoma, right eye, severe stage: Secondary | ICD-10-CM | POA: Diagnosis not present

## 2023-09-28 DIAGNOSIS — Z961 Presence of intraocular lens: Secondary | ICD-10-CM | POA: Diagnosis not present

## 2023-09-28 LAB — HM DIABETES EYE EXAM

## 2023-10-25 ENCOUNTER — Other Ambulatory Visit: Payer: Self-pay | Admitting: Internal Medicine

## 2023-10-27 DIAGNOSIS — H401122 Primary open-angle glaucoma, left eye, moderate stage: Secondary | ICD-10-CM | POA: Diagnosis not present

## 2023-10-27 DIAGNOSIS — H401113 Primary open-angle glaucoma, right eye, severe stage: Secondary | ICD-10-CM | POA: Diagnosis not present

## 2023-11-12 ENCOUNTER — Other Ambulatory Visit: Payer: Self-pay | Admitting: Internal Medicine

## 2023-11-15 ENCOUNTER — Other Ambulatory Visit: Payer: Self-pay

## 2023-11-15 MED ORDER — ROSUVASTATIN CALCIUM 10 MG PO TABS: 10.0000 mg | ORAL_TABLET | Freq: Every day | ORAL | 2 refills | Status: AC

## 2023-11-24 ENCOUNTER — Ambulatory Visit

## 2023-11-25 ENCOUNTER — Encounter: Payer: Self-pay | Admitting: Dermatology

## 2023-11-25 ENCOUNTER — Ambulatory Visit: Admitting: Dermatology

## 2023-11-25 VITALS — BP 105/70 | HR 62

## 2023-11-25 DIAGNOSIS — L658 Other specified nonscarring hair loss: Secondary | ICD-10-CM

## 2023-11-25 DIAGNOSIS — L649 Androgenic alopecia, unspecified: Secondary | ICD-10-CM

## 2023-11-25 DIAGNOSIS — L918 Other hypertrophic disorders of the skin: Secondary | ICD-10-CM | POA: Diagnosis not present

## 2023-11-25 DIAGNOSIS — L6681 Central centrifugal cicatricial alopecia: Secondary | ICD-10-CM | POA: Diagnosis not present

## 2023-11-25 DIAGNOSIS — L669 Cicatricial alopecia, unspecified: Secondary | ICD-10-CM

## 2023-11-25 MED ORDER — SAFETY SEAL MISCELLANEOUS MISC: 1.0000 | Freq: Every morning | 10 refills | Status: AC

## 2023-11-25 NOTE — Progress Notes (Signed)
   Follow-Up Visit   Subjective  Christina Anthony is a 68 y.o. female who presents for the following: Cicatricial Alopecia  Patient present today for follow up visit for cicatricial alopecia. Patient was last evaluated on 05/18/23. At this visit patient was advised to continue using MedRock minoxidil and clobetasol  and advised to continue using viviscal supplements and collagen. Patient reports sxs are better. Patient reports medication changes she is not on rosuvastatin .  The following portions of the chart were reviewed this encounter and updated as appropriate: medications, allergies, medical history  Review of Systems:  No other skin or systemic complaints except as noted in HPI or Assessment and Plan.  Objective  Well appearing patient in no apparent distress; mood and affect are within normal limits.   A focused examination was performed of the following areas: Scalp  Relevant exam findings are noted in the Assessment and Plan.             Assessment & Plan   1. 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 The frontal scalp and vertex show progress, though significant scarring remains in these areas. New baby hairs are visible, indicating treatment efficacy. However, extensive scarring in certain regions, especially the central area, suggests irreversible follicular damage due to delayed intervention. The temples and androgenetic component are responding well to current management. - Plan:    Continue current treatment regimen with clobetasol , minoxidil, and finasteride    Change formulation from cream to solution for better application and to prevent build-up    Continue collagen peptide powder with morning routine    Use clobetasol  as needed for itching    Anticipate longer, thicker, and darker hair growth by next visit  Mrs.  Oleski notes observing baby hairs in some areas, particularly at the temples and frontal hairline. However, he is aware that there is still significant scarring in certain regions, especially in the frontal scalp, vertex, and a large area towards the back of his scalp. Sheunderstands that the hair in heavily scarred areas may not fully regrow due to permanent follicle damage from inflammation.  2. Skin Tags on Neck - Assessment: Patient inquired about using clobetasol  for small lesions on the neck, likely skin tags. Clobetasol  is not appropriate for this condition as it can thin the skin and is too potent for use on the neck. - Plan:    Discontinue use of clobetasol  on neck    Educated patient that removal would require cosmetic procedure with injection and snipping, which is not covered by insurance    Option to keep skin tags as they are benign  Follow-up in 8 months to assess response to treatment modifications.       Return in about 8 months (around 07/24/2024) for Alopecia follow up.  I, Doyce Pan, CMA, am acting as scribe for [employer], DO.   Documentation: I have reviewed the above documentation for accuracy and completeness, and I agree with the above.  Delon Lenis, DO

## 2023-11-25 NOTE — Patient Instructions (Addendum)
 Date: Thu Nov 25 2023  Hello Christina Anthony,  Thank you for visiting today. Here is a summary of the key instructions:  - Medications:   - Continue using MedRock clobetasol  /minoxidil /finasteride compound solution every day in the morning   - Use clobetasol  cream only when itchy on the scalp   - Do not use clobetasol  on the neck  - Follow-up:   - Next appointment in 8 months  - Other Instructions:   - Continue taking collagen peptide powder with your morning routine   - The new prescription will be a solution instead of a cream for easier application   - Expect hair to be longer, thicker, and darker by the next appointment   - Message through MyChart for any questions  Please reach out if you have any questions or concerns.  Warm regards,  Dr. Delon Lenis [facility]     Important Information  Due to recent changes in healthcare laws, you may see results of your pathology and/or laboratory studies on MyChart before the doctors have had a chance to review them. We understand that in some cases there may be results that are confusing or concerning to you. Please understand that not all results are received at the same time and often the doctors may need to interpret multiple results in order to provide you with the best plan of care or course of treatment. Therefore, we ask that you please give us  2 business days to thoroughly review all your results before contacting the office for clarification. Should we see a critical lab result, you will be contacted sooner.   If You Need Anything After Your Visit  If you have any questions or concerns for your doctor, please call our main line at [phone] If no one answers, please leave a voicemail as directed and we will return your call as soon as possible. Messages left after 4 pm will be answered the following business day.   You may also send us  a message via MyChart. We typically respond to MyChart messages within 1-2 business  days.  For prescription refills, please ask your pharmacy to contact our office. Our fax number is [phone].  If you have an urgent issue when the clinic is closed that cannot wait until the next business day, you can page your doctor at the number below.    Please note that while we do our best to be available for urgent issues outside of office hours, we are not available 24/7.   If you have an urgent issue and are unable to reach us , you may choose to seek medical care at your doctor's office, retail clinic, urgent care center, or emergency room.  If you have a medical emergency, please immediately call 911 or go to the emergency department. In the event of inclement weather, please call our main line at [phone] for an update on the status of any delays or closures.  Dermatology Medication Tips: Please keep the boxes that topical medications come in in order to help keep track of the instructions about where and how to use these. Pharmacies typically print the medication instructions only on the boxes and not directly on the medication tubes.   If your medication is too expensive, please contact our office at [phone] or send us  a message through MyChart.   We are unable to tell what your co-pay for medications will be in advance as this is different depending on your insurance coverage. However, we may be able to  find a substitute medication at lower cost or fill out paperwork to get insurance to cover a needed medication.   If a prior authorization is required to get your medication covered by your insurance company, please allow us  1-2 business days to complete this process.  Drug prices often vary depending on where the prescription is filled and some pharmacies may offer cheaper prices.  The website www.goodrx.com contains coupons for medications through different pharmacies. The prices here do not account for what the cost may be with help from insurance (it may be  cheaper with your insurance), but the website can give you the price if you did not use any insurance.  - You can print the associated coupon and take it with your prescription to the pharmacy.  - You may also stop by our office during regular business hours and pick up a GoodRx coupon card.  - If you need your prescription sent electronically to a different pharmacy, notify our office through [facility] or by phone at [phone]

## 2023-12-02 ENCOUNTER — Other Ambulatory Visit: Payer: Self-pay | Admitting: Internal Medicine

## 2023-12-02 DIAGNOSIS — Z1231 Encounter for screening mammogram for malignant neoplasm of breast: Secondary | ICD-10-CM

## 2023-12-16 ENCOUNTER — Encounter: Payer: Self-pay | Admitting: Internal Medicine

## 2023-12-16 ENCOUNTER — Ambulatory Visit: Admitting: "Endocrinology

## 2023-12-16 ENCOUNTER — Ambulatory Visit: Payer: Medicare Other | Admitting: Internal Medicine

## 2023-12-16 VITALS — BP 124/82 | HR 68 | Temp 98.3°F | Ht 63.0 in | Wt 181.0 lb

## 2023-12-16 DIAGNOSIS — E042 Nontoxic multinodular goiter: Secondary | ICD-10-CM

## 2023-12-16 DIAGNOSIS — Z23 Encounter for immunization: Secondary | ICD-10-CM | POA: Diagnosis not present

## 2023-12-16 DIAGNOSIS — M25562 Pain in left knee: Secondary | ICD-10-CM | POA: Diagnosis not present

## 2023-12-16 DIAGNOSIS — H401133 Primary open-angle glaucoma, bilateral, severe stage: Secondary | ICD-10-CM

## 2023-12-16 DIAGNOSIS — I10 Essential (primary) hypertension: Secondary | ICD-10-CM

## 2023-12-16 DIAGNOSIS — E2839 Other primary ovarian failure: Secondary | ICD-10-CM | POA: Diagnosis not present

## 2023-12-16 DIAGNOSIS — E785 Hyperlipidemia, unspecified: Secondary | ICD-10-CM | POA: Diagnosis not present

## 2023-12-16 DIAGNOSIS — Z0000 Encounter for general adult medical examination without abnormal findings: Secondary | ICD-10-CM | POA: Diagnosis not present

## 2023-12-16 DIAGNOSIS — E7800 Pure hypercholesterolemia, unspecified: Secondary | ICD-10-CM

## 2023-12-16 DIAGNOSIS — E1169 Type 2 diabetes mellitus with other specified complication: Secondary | ICD-10-CM | POA: Diagnosis not present

## 2023-12-16 NOTE — Progress Notes (Signed)
 I,[name], CMA,acting as a Neurosurgeon for [name], MD.,have documented all relevant documentation on the behalf of [name], MD,as directed by  [name], MD while in the presence of [name], MD.  Subjective:    Patient ID: Christina Anthony , female    DOB: 12-10-1955 , 68 y.o.   MRN: [id number]  Chief Complaint  Patient presents with   Annual Exam    Patient presents today for annual exam. She reports compliance with medications. Denies headache, chest pain & sob. She states wanting her left knee examined. She feels she over did it in the gym.   Diabetes   Hypertension   Hyperlipidemia    HPI Discussed the use of AI scribe software for clinical note transcription with the patient, who gave verbal consent to proceed.  History of Present Illness Christina Anthony is a 68 year old female with diabetes who presents for a physical and diabetes check.  Her morning blood sugar levels are around 120 mg/dL, influenced by her diet. She is currently taking Farxiga .  She has been experiencing swelling and pain in her left knee for the past two months, which began in August and became significantly painful in September. She is unable to bear weight on it and experiences aching at night. She suspects it might be due to over-exercising, as she walks three miles and uses a bicycle for exercise. She has not tried any pain relief medications like Tylenol yet.  Her current medications include biotin, Zyrtec as needed, vitamin D, dorzolamide eye drops, folic acid, methotrexate, omeprazole 20 mg as needed, rosuvastatin  10 mg daily, and Maxidex 25 mg. She mentions a switch from clobetasol  cream to a liquid form for her alopecia treatment. Methotrexate is being used for scar tissue in her eye following surgery.  She had a follow-up ultrasound of her thyroid  in July for a nodule and is scheduled to see an endocrinologist next week. She is not currently on any thyroid   medication.  She exercises two to three times a week for about an hour each session. She drinks about three 18-ounce cups of water daily. She denies calf pain during exercise and reports regular bowel movements. She has a history of smoking.   Diabetes She presents for her follow-up diabetic visit. She has type 2 diabetes mellitus. Her disease course has been stable. There are no hypoglycemic associated symptoms. Pertinent negatives for diabetes include no blurred vision, no chest pain, no polydipsia, no polyphagia and no polyuria. There are no hypoglycemic complications. Risk factors for coronary artery disease include diabetes mellitus, dyslipidemia, hypertension and post-menopausal. When asked about current treatments, none were reported. She participates in exercise three times a week. An ACE inhibitor/angiotensin II receptor blocker is being taken.  Hypertension This is a chronic problem. The current episode started more than 1 year ago. The problem has been gradually improving since onset. The problem is controlled. Pertinent negatives include no blurred vision, chest pain, palpitations or shortness of breath. Risk factors for coronary artery disease include post-menopausal state and sedentary lifestyle. Past treatments include diuretics. The current treatment provides moderate improvement. Compliance problems include exercise.      Past Medical History:  Diagnosis Date   Chronic uveitis of both eyes    Diabetes mellitus without complication (HCC)    GERD (gastroesophageal reflux disease)    no meds currently   Glaucoma (increased eye pressure)    Goiter    Hypertension      Family  History  Problem Relation Age of Onset   Diabetes Mother    Hypertension Mother    Glaucoma Mother    Hypertension Father    Cancer Sister      Current Outpatient Medications:    benzonatate  (TESSALON  PERLES) 100 MG capsule, Take 1 capsule (100 mg total) by mouth 3 (three) times daily as needed for  cough., Disp: 30 capsule, Rfl: 1   Biotin (BIOTIN MAXIMUM STRENGTH) 10 MG TABS, Take 1 capsule by mouth daily., Disp: , Rfl:    Blood Glucose Monitoring Suppl (CONTOUR BLOOD GLUCOSE SYSTEM) w/Device KIT, USE ONCE DAILY TO CHECK BLOOD SUGARS., Disp: 1 kit, Rfl: 0   Blood Glucose Monitoring Suppl DEVI, May substitute to any manufacturer covered by patient's insurance., Disp: 1 each, Rfl: 0   brimonidine (ALPHAGAN) 0.2 % ophthalmic solution, 1 drop 3 (three) times daily. 1 drop tid right eye, bid left eye, Disp: , Rfl:    cetirizine (ZYRTEC) 10 MG tablet, Take 10 mg by mouth daily., Disp: , Rfl:    Cholecalciferol (VITAMIN D3) 25 MCG (1000 UT) CAPS, Take by mouth., Disp: , Rfl:    dorzolamide-timolol (COSOPT) 22.3-6.8 MG/ML ophthalmic solution, Place 1 drop into both eyes 2 (two) times daily., Disp: , Rfl:    FARXIGA  10 MG TABS tablet, TAKE 1 TABLET BY MOUTH EVERY DAY, Disp: 90 tablet, Rfl: 1   Folic Acid 5 MG CAPS, daily. , Disp: , Rfl:    methotrexate (RHEUMATREX) 2.5 MG tablet, 2.5 mg. 8 tabs weekly, Disp: , Rfl:    Multiple Vitamins-Minerals (CENTRUM SILVER 50+WOMEN) TABS, Take by mouth., Disp: , Rfl:    Omega-3 Fatty Acids (FISH OIL PO), Take by mouth., Disp: , Rfl:    omeprazole (PRILOSEC) 20 MG capsule, Take by mouth., Disp: , Rfl:    ONETOUCH ULTRA test strip, 1 EACH BY IN VITRO ROUTE IN THE MORNING, AT NOON, AND AT BEDTIME., Disp: 100 strip, Rfl: 0   rosuvastatin  (CRESTOR ) 10 MG tablet, Take 1 tablet (10 mg total) by mouth daily., Disp: 90 tablet, Rfl: 2   Safety Seal Miscellaneous MISC, Apply 1 Application topically in the morning. Medication name: hormonic hair solution (minoxidil USP 7% finasteride USP 0.05% clobetasol  0.05%), Disp: 30 mL, Rfl: 10   triamterene -hydrochlorothiazide (MAXZIDE-25) 37.5-25 MG tablet, TAKE 1 TABLET BY MOUTH EVERY DAY, Disp: 90 tablet, Rfl: 2   TURMERIC PO, Take 1 capsule by mouth daily., Disp: , Rfl:    clobetasol  (TEMOVATE ) 0.05 % external solution, Apply 1  Application topically at bedtime. (Patient not taking: Reported on 12/16/2023), Disp: 50 mL, Rfl: 1   No Known Allergies    The patient states she uses post menopausal status for birth control. No LMP recorded. Patient is postmenopausal.. Negative for Dysmenorrhea. Negative for: breast discharge, breast lump(s), breast pain and breast self exam. Associated symptoms include abnormal vaginal bleeding. Pertinent negatives include abnormal bleeding (hematology), anxiety, decreased libido, depression, difficulty falling sleep, dyspareunia, history of infertility, nocturia, sexual dysfunction, sleep disturbances, urinary incontinence, urinary urgency, vaginal discharge and vaginal itching. Diet regular.The patient states her exercise level is  intermittent.  . The patient's tobacco use is:  Social History   Tobacco Use  Smoking Status Every Day   Current packs/day: 0.25   Average packs/day: 0.3 packs/day for 48.0 years (12.0 ttl pk-yrs)   Types: Cigarettes  Smokeless Tobacco Never  Tobacco Comments   decrease number of cigs smoked per day  . She has been exposed to passive smoke. The patient's alcohol use  is:  Social History   Substance and Sexual Activity  Alcohol Use Yes   Alcohol/week: 1.0 standard drink of alcohol   Types: 1 Cans of beer per week   Comment: social   Review of Systems  Constitutional: Negative.   HENT: Negative.    Eyes: Negative.  Negative for blurred vision.  Respiratory: Negative.  Negative for shortness of breath.   Cardiovascular: Negative.  Negative for chest pain and palpitations.  Gastrointestinal: Negative.   Endocrine: Negative.  Negative for polydipsia, polyphagia and polyuria.  Genitourinary: Negative.   Musculoskeletal: Negative.   Skin: Negative.   Allergic/Immunologic: Negative.   Neurological: Negative.   Hematological: Negative.   Psychiatric/Behavioral: Negative.       Today's Vitals   12/16/23 1058  BP: 124/82  Pulse: 68  Temp: 98.3 F  (36.8 C)  SpO2: 98%  Weight: 181 lb (82.1 kg)  Height: 5' 3 (1.6 m)   Body mass index is 32.06 kg/m.  Wt Readings from Last 3 Encounters:  12/16/23 181 lb (82.1 kg)  08/24/23 179 lb (81.2 kg)  04/21/23 177 lb 6.4 oz (80.5 kg)     Objective:  Physical Exam Vitals and nursing note reviewed.  Constitutional:      Appearance: Normal appearance.  HENT:     Head: Normocephalic and atraumatic.     Right Ear: Tympanic membrane, ear canal and external ear normal.     Left Ear: Tympanic membrane, ear canal and external ear normal.     Nose: Nose normal.     Mouth/Throat:     Mouth: Mucous membranes are moist.     Pharynx: Oropharynx is clear.  Eyes:     Extraocular Movements: Extraocular movements intact.     Conjunctiva/sclera: Conjunctivae normal.     Comments: Pupillary scarring b/l, arcus senilis  Cardiovascular:     Rate and Rhythm: Normal rate and regular rhythm.     Pulses:          Dorsalis pedis pulses are 1+ on the right side and 2+ on the left side.     Heart sounds: Normal heart sounds.  Pulmonary:     Effort: Pulmonary effort is normal.     Breath sounds: Normal breath sounds.  Chest:  Breasts:    Tanner Score is 5.     Right: Normal.     Left: Normal.  Abdominal:     General: Bowel sounds are normal.     Palpations: Abdomen is soft.     Tenderness: There is no abdominal tenderness. There is no guarding or rebound.  Genitourinary:    Comments: deferred Musculoskeletal:        General: Normal range of motion.     Cervical back: Normal range of motion and neck supple.  Feet:     Right foot:     Protective Sensation: 5 sites tested.  5 sites sensed.     Skin integrity: Dry skin present.     Toenail Condition: Right toenails are normal.     Left foot:     Protective Sensation: 5 sites tested.  5 sites sensed.     Skin integrity: Dry skin present.     Toenail Condition: Left toenails are normal.  Skin:    General: Skin is warm and dry.  Neurological:      General: No focal deficit present.     Mental Status: She is alert and oriented to person, place, and time.  Psychiatric:  Mood and Affect: Mood normal.        Behavior: Behavior normal.         Assessment And Plan:     Encounter for annual health examination Assessment & Plan: A full exam was performed.  Importance of monthly self breast exams was discussed with the patient.  She is advised to get 30-45 minutes of regular exercise, no less than four to five days per week. Both weight-bearing and aerobic exercises are recommended.  She is advised to follow a healthy diet with at least six fruits/veggies per day, decrease intake of red meat and other saturated fats and to increase fish intake to twice weekly.  Meats/fish should not be fried -- baked, boiled or broiled is preferable. It is also important to cut back on your sugar intake.  Be sure to read labels - try to avoid anything with added sugar, high fructose corn syrup or other sweeteners.  If you must use a sweetener, you can try stevia or monkfruit.  It is also important to avoid artificially sweetened foods/beverages and diet drinks. Lastly, wear SPF 50 sunscreen on exposed skin and when in direct sunlight for an extended period of time.  Be sure to avoid fast food restaurants and aim for at least 60 ounces of water daily.       Dyslipidemia associated with type 2 diabetes mellitus (HCC) Assessment & Plan: Type 2 diabetes mellitus with morning blood glucose levels around 120 mg/dL, slightly above target. Sensation in feet intact on exam. - Check sensation in feet.  Hypercholesterolemia, recently switched from pravastatin  to rosuvastatin . Need to monitor cholesterol levels after medication change. - Encouraged cardiac calcium  score for early detection of heart disease, especially given family history. Explained insurance does not cover cardiac calcium  score, costing $99, and its role in identifying heart disease early to prevent  future heart attacks. - Schedule lab visit for cholesterol check in six weeks. - Stop pravastatin  and continue rosuvastatin . - Encourage monitoring of blood glucose levels. - Continue current diabetes medications.  Orders: -     CBC -     CMP14+EGFR -     Hemoglobin A1c -     POCT URINALYSIS DIP (CLINITEK) -     Microalbumin / creatinine urine ratio -     EKG 12-Lead -     CT CARDIAC SCORING (SELF PAY ONLY); Future  Essential hypertension, benign Assessment & Plan: Chronic, fair control.  Goal BP < 130/80.  EKG performed, NSR w/ nonspecific T abnormality.. Plan to switch diuretic to triamterene  to better protect kidneys. Current kidney function is stable. - Switch Maxzide to triamterene  after current supply is finished. - Follow low sodium diet.    Multinodular goiter Assessment & Plan: Nontoxic multinodular goiter, follow-up with endocrinologist scheduled. No current thyroid  medication. - Check thyroid  labs before endocrinologist visit.  Orders: -     TSH + free T4 -     Thyroid  antibodies (Thyroperoxidase & Thyroglobulin) -     T3, free  Acute pain of left knee Assessment & Plan: Left knee pain and swelling for two months, possibly due to overuse or strain. Differential includes osteoarthritis or muscle strain. - Refer to orthopedic specialist. - Order knee X-ray. - Recommend Tylenol for pain management.  Orders: -     Ambulatory referral to Orthopedic Surgery  Primary open angle glaucoma of both eyes, severe stage Assessment & Plan: Nontoxic multinodular goiter, follow-up with endocrinologist scheduled. No current thyroid  medication. - Check thyroid  labs before  endocrinologist visit.   Estrogen deficiency -     DG Bone Density; Future  Immunization due -     Flu vaccine HIGH DOSE PF(Fluzone Trivalent)   Return in 6 weeks (on 01/27/2024), or chol check, for 1 year HM, 4 month dm f/u.SABRA Patient was given opportunity to ask questions. Patient verbalized  understanding of the plan and was able to repeat key elements of the plan. All questions were answered to their satisfaction.   I, [name], MD, have reviewed all documentation for this visit. The documentation on 12/16/23 for the exam, diagnosis, procedures, and orders are all accurate and complete.

## 2023-12-16 NOTE — Assessment & Plan Note (Signed)

## 2023-12-16 NOTE — Patient Instructions (Signed)

## 2023-12-17 LAB — CBC
Hematocrit: 41.1 % (ref 34.0–46.6)
Hemoglobin: 13.7 g/dL (ref 11.1–15.9)
MCH: 30.7 pg (ref 26.6–33.0)
MCHC: 33.3 g/dL (ref 31.5–35.7)
MCV: 92 fL (ref 79–97)
Platelets: 276 x10E3/uL (ref 150–450)
RBC: 4.46 x10E6/uL (ref 3.77–5.28)
RDW: 14.4 % (ref 11.7–15.4)
WBC: 10.8 x10E3/uL (ref 3.4–10.8)

## 2023-12-17 LAB — CMP14+EGFR
ALT: 19 IU/L (ref 0–32)
AST: 18 IU/L (ref 0–40)
Albumin: 4.3 g/dL (ref 3.9–4.9)
Alkaline Phosphatase: 72 IU/L (ref 49–135)
BUN/Creatinine Ratio: 21 (ref 12–28)
BUN: 19 mg/dL (ref 8–27)
Bilirubin Total: 0.3 mg/dL (ref 0.0–1.2)
CO2: 25 mmol/L (ref 20–29)
Calcium: 10 mg/dL (ref 8.7–10.3)
Chloride: 102 mmol/L (ref 96–106)
Creatinine, Ser: 0.9 mg/dL (ref 0.57–1.00)
Globulin, Total: 2.7 g/dL (ref 1.5–4.5)
Glucose: 79 mg/dL (ref 70–99)
Potassium: 3.9 mmol/L (ref 3.5–5.2)
Sodium: 142 mmol/L (ref 134–144)
Total Protein: 7 g/dL (ref 6.0–8.5)
eGFR: 70 mL/min/1.73 (ref >59)

## 2023-12-17 LAB — THYROID ANTIBODIES (THYROPEROXIDASE & THYROGLOBULIN)
Thyroglobulin Antibody: <1 [IU]/mL (ref 0.0–0.9)
Thyroperoxidase Ab SerPl-aCnc: 186 [IU]/mL — ABNORMAL HIGH (ref 0–34)

## 2023-12-17 LAB — TSH+FREE T4
Free T4: 1.2 ng/dL (ref 0.82–1.77)
TSH: 0.263 u[IU]/mL — ABNORMAL LOW (ref 0.450–4.500)

## 2023-12-17 LAB — HEMOGLOBIN A1C
Est. average glucose Bld gHb Est-mCnc: 131 mg/dL
Hgb A1c MFr Bld: 6.2 % — ABNORMAL HIGH (ref 4.8–5.6)

## 2023-12-17 LAB — T3, FREE: T3, Free: 3.3 pg/mL (ref 2.0–4.4)

## 2023-12-19 DIAGNOSIS — M25562 Pain in left knee: Secondary | ICD-10-CM | POA: Insufficient documentation

## 2023-12-19 NOTE — Assessment & Plan Note (Signed)
 Left knee pain and swelling for two months, possibly due to overuse or strain. Differential includes osteoarthritis or muscle strain. - Refer to orthopedic specialist. - Order knee X-ray. - Recommend Tylenol for pain management.

## 2023-12-19 NOTE — Assessment & Plan Note (Addendum)
 Type 2 diabetes mellitus with morning blood glucose levels around 120 mg/dL, slightly above target. Sensation in feet intact on exam. - Check sensation in feet.  Hypercholesterolemia, recently switched from pravastatin  to rosuvastatin . Need to monitor cholesterol levels after medication change. - Encouraged cardiac calcium  score for early detection of heart disease, especially given family history. Explained insurance does not cover cardiac calcium  score, costing $99, and its role in identifying heart disease early to prevent future heart attacks. - Schedule lab visit for cholesterol check in six weeks. - Stop pravastatin  and continue rosuvastatin . - Encourage monitoring of blood glucose levels. - Continue current diabetes medications.

## 2023-12-19 NOTE — Assessment & Plan Note (Signed)
 Nontoxic multinodular goiter, follow-up with endocrinologist scheduled. No current thyroid  medication. - Check thyroid  labs before endocrinologist visit.

## 2023-12-19 NOTE — Assessment & Plan Note (Addendum)
 Chronic, fair control.  Goal BP < 130/80.  EKG performed, NSR w/ nonspecific T abnormality.. Plan to switch diuretic to triamterene  to better protect kidneys. Current kidney function is stable. - Switch Maxzide to triamterene  after current supply is finished. - Follow low sodium diet.

## 2023-12-20 ENCOUNTER — Ambulatory Visit: Payer: Self-pay | Admitting: Internal Medicine

## 2023-12-20 ENCOUNTER — Encounter: Payer: Self-pay | Admitting: Internal Medicine

## 2023-12-23 ENCOUNTER — Encounter: Payer: Self-pay | Admitting: "Endocrinology

## 2023-12-23 ENCOUNTER — Ambulatory Visit: Admitting: "Endocrinology

## 2023-12-23 VITALS — BP 110/80 | HR 73 | Ht 63.0 in | Wt 180.0 lb

## 2023-12-23 DIAGNOSIS — E042 Nontoxic multinodular goiter: Secondary | ICD-10-CM

## 2023-12-23 DIAGNOSIS — E0590 Thyrotoxicosis, unspecified without thyrotoxic crisis or storm: Secondary | ICD-10-CM

## 2023-12-23 NOTE — Progress Notes (Signed)
 Outpatient Endocrinology Note Obadiah Birmingham, MD  12/23/23   Christina Anthony 1955-10-19 [id number]  Referring Provider: Jarold Medici, MD Primary Care Provider: Jarold Medici, MD Subjective  No chief complaint on file.   Assessment & Plan  Diagnoses and all orders for this visit:  Subclinical hyperthyroidism -     TSH + free T4 -     TRAb (TSH Receptor Binding Antibody) -     Thyroid  stimulating immunoglobulin  Multinodular goiter    Tiaria Biby is currently taking no thyroid  medication. Patient currently clinically euthyroid, biochemically subclinically hypothyroid but seems like TSH 0.2-0.5 is her baseline and normal for her. No history of arrhthymias/palpitations/fractures/osteoporosis.  Discussed the etiology for hyperthyroidism. Educated on thyroid  axis.  Recommend the following: continue monitoring thyroid  labs Q6 mo. Repeat labs in 3 months or sooner if symptoms of hyper or hypothyroidism develop.   Reviewed last thyroid  ultrasound report and images done in 08/2023 as well as compare the dimensions of the nodules from previous studies; the 2 big nodules have not grown by 50% 01/2020 Left inferior thyroid  nodule (labeled 4, 4.0 cm, TR 3): Consistent with benign follicular nodule (Bethesda category II)  10/2021 Nodule #2: Left; Mid, Maximum size: 1.7 cm x 1.7 x 1.0 cm: Consistent with benign follicular nodule (Bethesda category II)  Patient has multinodular goiter with stable nodules and no current need for a follow-up biopsy Recommend follow-up thyroid  ultrasound in 2-3 years around 2027-2028  I have reviewed current medications, nurse's notes, allergies, vital signs, past medical and surgical history, family medical history, and social history for this encounter. Counseled patient on symptoms, examination findings, lab findings, imaging results, treatment decisions and monitoring and prognosis. The patient understood the recommendations and agrees  with the treatment plan. All questions regarding treatment plan were fully answered.   Return in about 4 months (around 04/24/2024) for visit + labs before next visit.   Obadiah Birmingham, MD  12/23/23   I have reviewed current medications, nurse's notes, allergies, vital signs, past medical and surgical history, family medical history, and social history for this encounter. Counseled patient on symptoms, examination findings, lab findings, imaging results, treatment decisions and monitoring and prognosis. The patient understood the recommendations and agrees with the treatment plan. All questions regarding treatment plan were fully answered.   History of Present Illness Christina Anthony is a 68 y.o. year old female who presents to our clinic with multinodular goiter diagnosed about 20 years ago?   Symptoms suggestive of HYPOTHYROIDISM:  weight gain No cold intolerance  No constipation  No  Symptoms suggestive of HYPERTHYROIDISM:  weight loss  No heat intolerance No hyperdefecation  No palpitations  No  Compressive symptoms:  dysphagia  No dysphonia  No positional dyspnea (especially with simultaneous arms elevation)  No    Physical Exam  BP 110/80   Pulse 73   Ht 5' 3 (1.6 m)   Wt 180 lb (81.6 kg)   SpO2 98%   BMI 31.89 kg/m  Constitutional: well developed, well nourished Head: normocephalic, atraumatic, no exophthalmos Eyes: sclera anicteric, no redness Neck: + thyromegaly, no thyroid  tenderness; + nodules palpated Lungs: normal respiratory effort Neurology: alert and oriented Skin: dry, no appreciable rashes Musculoskeletal: no appreciable defects Psychiatric: normal mood and affect  Allergies No Known Allergies  Current Medications Patient's Medications  New Prescriptions   No medications on file  Previous Medications   BENZONATATE  (TESSALON  PERLES) 100 MG CAPSULE    Take 1 capsule (100  mg total) by mouth 3 (three) times daily as needed for cough.    BIOTIN (BIOTIN MAXIMUM STRENGTH) 10 MG TABS    Take 1 capsule by mouth daily.   BLOOD GLUCOSE MONITORING SUPPL (CONTOUR BLOOD GLUCOSE SYSTEM) W/DEVICE KIT    USE ONCE DAILY TO CHECK BLOOD SUGARS.   BLOOD GLUCOSE MONITORING SUPPL DEVI    May substitute to any manufacturer covered by patient's insurance.   BRIMONIDINE (ALPHAGAN) 0.2 % OPHTHALMIC SOLUTION    1 drop 3 (three) times daily. 1 drop tid right eye, bid left eye   CETIRIZINE (ZYRTEC) 10 MG TABLET    Take 10 mg by mouth daily.   CHOLECALCIFEROL (VITAMIN D3) 25 MCG (1000 UT) CAPS    Take by mouth.   CLOBETASOL  (TEMOVATE ) 0.05 % EXTERNAL SOLUTION    Apply 1 Application topically at bedtime.   DORZOLAMIDE-TIMOLOL (COSOPT) 22.3-6.8 MG/ML OPHTHALMIC SOLUTION    Place 1 drop into both eyes 2 (two) times daily.   FARXIGA  10 MG TABS TABLET    TAKE 1 TABLET BY MOUTH EVERY DAY   FOLIC ACID 5 MG CAPS    daily.    METHOTREXATE (RHEUMATREX) 2.5 MG TABLET    2.5 mg. 8 tabs weekly   MULTIPLE VITAMINS-MINERALS (CENTRUM SILVER 50+WOMEN) TABS    Take by mouth.   OMEGA-3 FATTY ACIDS (FISH OIL PO)    Take by mouth.   OMEPRAZOLE (PRILOSEC) 20 MG CAPSULE    Take by mouth.   ONETOUCH ULTRA TEST STRIP    1 EACH BY IN VITRO ROUTE IN THE MORNING, AT NOON, AND AT BEDTIME.   ROSUVASTATIN  (CRESTOR ) 10 MG TABLET    Take 1 tablet (10 mg total) by mouth daily.   SAFETY SEAL MISCELLANEOUS MISC    Apply 1 Application topically in the morning. Medication name: hormonic hair solution (minoxidil USP 7% finasteride USP 0.05% clobetasol  0.05%)   TRIAMTERENE -HYDROCHLOROTHIAZIDE (MAXZIDE-25) 37.5-25 MG TABLET    TAKE 1 TABLET BY MOUTH EVERY DAY   TURMERIC PO    Take 1 capsule by mouth daily.  Modified Medications   No medications on file  Discontinued Medications   No medications on file    Past Medical History Past Medical History:  Diagnosis Date   Chronic uveitis of both eyes    Diabetes mellitus without complication (HCC)    GERD (gastroesophageal reflux disease)     no meds currently   Glaucoma (increased eye pressure)    Goiter    Hypertension     Past Surgical History Past Surgical History:  Procedure Laterality Date   COLONOSCOPY     NO PAST SURGERIES     UPPER GASTROINTESTINAL ENDOSCOPY      Family History family history includes Cancer in her sister; Diabetes in her mother; Glaucoma in her mother; Hypertension in her father and mother.  Social History Social History   Socioeconomic History   Marital status: Married    Spouse name: Not on file   Number of children: Not on file   Years of education: Not on file   Highest education level: Not on file  Occupational History   Not on file  Tobacco Use   Smoking status: Every Day    Current packs/day: 0.25    Average packs/day: 0.3 packs/day for 48.0 years (12.0 ttl pk-yrs)    Types: Cigarettes   Smokeless tobacco: Never   Tobacco comments:    decrease number of cigs smoked per day  Vaping Use   Vaping status: Never Used  Substance and Sexual Activity   Alcohol use: Yes    Alcohol/week: 1.0 standard drink of alcohol    Types: 1 Cans of beer per week    Comment: social   Drug use: No   Sexual activity: Not on file  Other Topics Concern   Not on file  Social History Narrative   Not on file   Social Drivers of Health   Financial Resource Strain: Low Risk  (09/08/2023)   Overall Financial Resource Strain (CARDIA)    Difficulty of Paying Living Expenses: Not hard at all  Food Insecurity: No Food Insecurity (09/08/2023)   Hunger Vital Sign    Worried About Running Out of Food in the Last Year: Never true    Ran Out of Food in the Last Year: Never true  Transportation Needs: No Transportation Needs (09/08/2023)   PRAPARE - Administrator, Civil Service (Medical): No    Lack of Transportation (Non-Medical): No  Physical Activity: Sufficiently Active (09/08/2023)   Exercise Vital Sign    Days of Exercise per Week: 3 days    Minutes of Exercise per Session: 60 min   Stress: No Stress Concern Present (09/08/2023)   [employer] of Occupational Health - Occupational Stress Questionnaire    Feeling of Stress: Not at all  Social Connections: Socially Integrated (09/08/2023)   Social Connection and Isolation Panel    Frequency of Communication with Friends and Family: More than three times a week    Frequency of Social Gatherings with Friends and Family: More than three times a week    Attends Religious Services: More than 4 times per year    Active Member of Clubs or Organizations: Yes    Attends Banker Meetings: More than 4 times per year    Marital Status: Married  Catering manager Violence: Not At Risk (09/08/2023)   Humiliation, Afraid, Rape, and Kick questionnaire    Fear of Current or Ex-Partner: No    Emotionally Abused: No    Physically Abused: No    Sexually Abused: No    Laboratory Investigations Lab Results  Component Value Date   TSH 0.263 (L) 12/16/2023   TSH 0.368 (L) 08/24/2023   TSH 0.563 05/07/2022   FREET4 1.20 12/16/2023   FREET4 1.22 08/24/2023   FREET4 1.18 05/16/2020     No results found for: TSI   No components found for: TRAB   Lab Results  Component Value Date   CHOL 203 (H) 08/24/2023   Lab Results  Component Value Date   HDL 50 08/24/2023   Lab Results  Component Value Date   LDLCALC 132 (H) 08/24/2023   Lab Results  Component Value Date   TRIG 118 08/24/2023   Lab Results  Component Value Date   CHOLHDL 4.1 08/24/2023   Lab Results  Component Value Date   CREATININE 0.90 12/16/2023   No results found for: GFR    Component Value Date/Time   NA 142 12/16/2023 1144   K 3.9 12/16/2023 1144   CL 102 12/16/2023 1144   CO2 25 12/16/2023 1144   GLUCOSE 79 12/16/2023 1144   GLUCOSE 103 (H) 06/27/2015 1015   BUN 19 12/16/2023 1144   CREATININE 0.90 12/16/2023 1144   CALCIUM  10.0 12/16/2023 1144   PROT 7.0 12/16/2023 1144   ALBUMIN 4.3 12/16/2023 1144   AST 18 12/16/2023 1144    ALT 19 12/16/2023 1144   ALKPHOS 72 12/16/2023 1144   BILITOT 0.3 12/16/2023 1144  GFRNONAA 60 11/15/2019 1208   GFRAA 69 11/15/2019 1208      Latest Ref Rng & Units 12/16/2023   11:44 AM 08/24/2023   11:38 AM 04/21/2023   10:04 AM  BMP  Glucose 70 - 99 mg/dL 79  81  95   BUN 8 - 27 mg/dL 19  16  10    Creatinine 0.57 - 1.00 mg/dL 9.09  9.17  9.15   BUN/Creat Ratio 12 - 28 21  20  12    Sodium 134 - 144 mmol/L 142  142  141   Potassium 3.5 - 5.2 mmol/L 3.9  3.7  3.7   Chloride 96 - 106 mmol/L 102  100  101   CO2 20 - 29 mmol/L 25  24  25    Calcium  8.7 - 10.3 mg/dL 89.9  89.5  9.9        Component Value Date/Time   WBC 10.8 12/16/2023 1144   WBC 8.9 06/27/2015 1015   RBC 4.46 12/16/2023 1144   RBC 4.22 06/27/2015 1015   HGB 13.7 12/16/2023 1144   HCT 41.1 12/16/2023 1144   PLT 276 12/16/2023 1144   MCV 92 12/16/2023 1144   MCH 30.7 12/16/2023 1144   MCH 29.4 06/27/2015 1015   MCHC 33.3 12/16/2023 1144   MCHC 32.6 06/27/2015 1015   RDW 14.4 12/16/2023 1144   LYMPHSABS 3.2 (H) 05/15/2019 1500   MONOABS 0.6 06/27/2015 1015   EOSABS 0.2 05/15/2019 1500   BASOSABS 0.1 05/15/2019 1500      Parts of this note may have been dictated using voice recognition software. There may be variances in spelling and vocabulary which are unintentional. Not all errors are proofread. Please notify the dino if any discrepancies are noted or if the meaning of any statement is not clear.

## 2023-12-29 ENCOUNTER — Ambulatory Visit (HOSPITAL_COMMUNITY)
Admission: RE | Admit: 2023-12-29 | Discharge: 2023-12-29 | Disposition: A | Discharge status: Home / Self Care | Payer: Self-pay | Source: Ambulatory Visit | Attending: Internal Medicine | Admitting: Internal Medicine

## 2023-12-29 DIAGNOSIS — E785 Hyperlipidemia, unspecified: Secondary | ICD-10-CM | POA: Insufficient documentation

## 2023-12-29 DIAGNOSIS — E1169 Type 2 diabetes mellitus with other specified complication: Secondary | ICD-10-CM | POA: Insufficient documentation

## 2024-01-06 ENCOUNTER — Other Ambulatory Visit (INDEPENDENT_AMBULATORY_CARE_PROVIDER_SITE_OTHER): Payer: Self-pay

## 2024-01-06 ENCOUNTER — Ambulatory Visit: Admitting: Orthopaedic Surgery

## 2024-01-06 DIAGNOSIS — G8929 Other chronic pain: Secondary | ICD-10-CM

## 2024-01-06 DIAGNOSIS — M25562 Pain in left knee: Secondary | ICD-10-CM

## 2024-01-06 MED ORDER — LIDOCAINE HCL 1 % IJ SOLN
2.0000 mL | INTRAMUSCULAR | Status: AC | PRN
  Administered 2024-01-06: 2 mL

## 2024-01-06 MED ORDER — BUPIVACAINE HCL 0.5 % IJ SOLN
2.0000 mL | INTRAMUSCULAR | Status: AC | PRN
  Administered 2024-01-06: 2 mL via INTRA_ARTICULAR

## 2024-01-06 MED ORDER — METHYLPREDNISOLONE ACETATE 40 MG/ML IJ SUSP
40.0000 mg | INTRAMUSCULAR | Status: AC | PRN
  Administered 2024-01-06: 40 mg via INTRA_ARTICULAR

## 2024-01-06 NOTE — Progress Notes (Signed)
 Office Visit Note   Patient: Christina Anthony           Date of Birth: 12/25/1955           MRN: [id number] Visit Date: 01/06/2024              Requested by: Jarold Medici, MD [address] [place],  [place] [address] PCP: Jarold Medici, MD   Assessment & Plan: Visit Diagnoses:  1. Chronic pain of left knee     Plan: History of Present Illness Christina Anthony is a 68 year old female who presents with left knee pain for three months.  She experiences left knee pain exacerbated by weight-bearing activities such as walking or standing. The pain radiates from the thigh to the back and side of the knee, particularly when sitting. Occasional clicking, catching, or locking sensations occur in the knee. Her physical activity includes walking three miles three times a week and using a stationary bike for two miles. She is attempting weight loss and is concerned about potentially pulling a muscle or aggravating arthritis. There is no hip, groin, or back pain.  Physical Exam MUSCULOSKELETAL: No tenderness on palpation of the hip. Mild pain in the inner thigh on hip rotation. No pain on hip flexion and extension. No pain on hip rotation. No effusion in the left knee, good flexibility. Tenderness along the medial and lateral joint lines of the left knee. Ligaments of the left knee are stable.  Assessment and Plan Left knee osteoarthritis with pain Chronic left knee pain exacerbated by weight-bearing. X-rays show patellar spurring, suggesting arthritis. Differential includes arthritis flare and possible degenerative meniscus tear. Arthritis not severe. - Administered cortisone injection for pain management. - Provided exercises to strengthen quadriceps muscles. - Discussed weight management as a factor in knee pain.  Follow-Up Instructions: No follow-ups on file.   Orders:  Orders Placed This Encounter  Procedures   XR KNEE 3 VIEW LEFT   No orders of the defined types  were placed in this encounter.     Procedures: Large Joint Inj: L knee on 01/06/2024 7:57 PM Details: 22 G needle Medications: 2 mL bupivacaine 0.5 %; 2 mL lidocaine  1 %; 40 mg methylPREDNISolone acetate 40 MG/ML Outcome: tolerated well, no immediate complications Patient was prepped and draped in the usual sterile fashion.       Clinical Data: No additional findings.   Subjective: Chief Complaint  Patient presents with   Left Knee - Pain    HPI  Review of Systems  Constitutional: Negative.   HENT: Negative.    Eyes: Negative.   Respiratory: Negative.    Cardiovascular: Negative.   Endocrine: Negative.   Musculoskeletal: Negative.   Neurological: Negative.   Hematological: Negative.   Psychiatric/Behavioral: Negative.    All other systems reviewed and are negative.    Objective: Vital Signs: There were no vitals taken for this visit.  Physical Exam Vitals and nursing note reviewed.  Constitutional:      Appearance: She is well-developed.  HENT:     Head: Atraumatic.     Nose: Nose normal.  Eyes:     Extraocular Movements: Extraocular movements intact.  Cardiovascular:     Pulses: Normal pulses.  Pulmonary:     Effort: Pulmonary effort is normal.  Abdominal:     Palpations: Abdomen is soft.  Musculoskeletal:     Cervical back: Neck supple.  Skin:    General: Skin is warm.     Capillary  Refill: Capillary refill takes less than 2 seconds.  Neurological:     Mental Status: She is alert. Mental status is at baseline.  Psychiatric:        Behavior: Behavior normal.        Thought Content: Thought content normal.        Judgment: Judgment normal.     Ortho Exam  Specialty Comments:  No specialty comments available.  Imaging: XR KNEE 3 VIEW LEFT Result Date: 01/06/2024 X-rays of the left knee show no acute or structural abnormalities    PMFS History: Patient Active Problem List   Diagnosis Date Noted   Acute pain of left knee 12/19/2023    Encounter for annual health examination 12/16/2023   Multinodular goiter 08/25/2023   Acute non-recurrent maxillary sinusitis 04/21/2023   Dyslipidemia associated with type 2 diabetes mellitus (HCC) 12/20/2022   COVID-19 10/06/2022   Cigarette nicotine dependence without complication 10/06/2022   Diverticular disease of colon 09/17/2022   Gastroesophageal reflux disease 09/17/2022   History of colonic polyps 09/17/2022   Obesity 09/17/2022   Snores 06/20/2019   High risk medication use 02/11/2016   Pseudophakia, both eyes 11/05/2015   Status post left cataract extraction 10/23/2015   Thyroid  goiter 10/16/2015   Primary open angle glaucoma of both eyes, severe stage 09/10/2015   Hypertensive retinopathy of both eyes 05/28/2015   Macular retinal puckering, right eye 05/28/2015   Nuclear sclerotic cataract of left eye 05/28/2015   Chronic iridocyclitis, 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   HLD (hyperlipidemia) 01/25/2013   Primary open angle glaucoma of left eye, moderate stage 11/25/2012   Primary open angle glaucoma of right eye, severe stage 11/25/2012   Cataract extraction status of right eye 10/06/2012   Posterior synechiae of right eye 08/15/2012   Essential hypertension, benign 06/12/2011   History of gastroesophageal reflux (GERD) 06/12/2011   Postmenopausal bleeding 11/28/2010   Endometrial polyp 11/28/2010   Past Medical History:  Diagnosis Date   Chronic uveitis of both eyes    Diabetes mellitus without complication (HCC)    GERD (gastroesophageal reflux disease)    no meds currently   Glaucoma (increased eye pressure)    Goiter    Hypertension     Family History  Problem Relation Age of Onset   Diabetes Mother    Hypertension Mother    Glaucoma Mother    Hypertension Father    Cancer Sister     Past Surgical History:  Procedure Laterality Date   COLONOSCOPY     NO PAST SURGERIES     UPPER GASTROINTESTINAL ENDOSCOPY     Social History   Occupational History   Not  on file  Tobacco Use   Smoking status: Every Day    Current packs/day: 0.25    Average packs/day: 0.3 packs/day for 48.0 years (12.0 ttl pk-yrs)    Types: Cigarettes   Smokeless tobacco: Never   Tobacco comments:    decrease number of cigs smoked per day  Vaping Use   Vaping status: Never Used  Substance and Sexual Activity   Alcohol use: Yes    Alcohol/week: 1.0 standard drink of alcohol    Types: 1 Cans of beer per week    Comment: social   Drug use: No   Sexual activity: Not on file

## 2024-01-17 ENCOUNTER — Ambulatory Visit
Admission: RE | Admit: 2024-01-17 | Discharge: 2024-01-17 | Disposition: A | Discharge status: Home / Self Care | Source: Ambulatory Visit | Attending: Internal Medicine | Admitting: Internal Medicine

## 2024-01-17 DIAGNOSIS — Z1231 Encounter for screening mammogram for malignant neoplasm of breast: Secondary | ICD-10-CM

## 2024-01-31 DIAGNOSIS — H2013 Chronic iridocyclitis, bilateral: Secondary | ICD-10-CM | POA: Diagnosis not present

## 2024-01-31 DIAGNOSIS — Z79899 Other long term (current) drug therapy: Secondary | ICD-10-CM | POA: Diagnosis not present

## 2024-02-01 DIAGNOSIS — Z961 Presence of intraocular lens: Secondary | ICD-10-CM | POA: Diagnosis not present

## 2024-02-01 DIAGNOSIS — H35371 Puckering of macula, right eye: Secondary | ICD-10-CM | POA: Diagnosis not present

## 2024-02-01 DIAGNOSIS — H401122 Primary open-angle glaucoma, left eye, moderate stage: Secondary | ICD-10-CM | POA: Diagnosis not present

## 2024-02-01 DIAGNOSIS — Z79899 Other long term (current) drug therapy: Secondary | ICD-10-CM | POA: Diagnosis not present

## 2024-02-01 DIAGNOSIS — H2013 Chronic iridocyclitis, bilateral: Secondary | ICD-10-CM | POA: Diagnosis not present

## 2024-02-01 DIAGNOSIS — H401113 Primary open-angle glaucoma, right eye, severe stage: Secondary | ICD-10-CM | POA: Diagnosis not present

## 2024-02-01 DIAGNOSIS — Z9889 Other specified postprocedural states: Secondary | ICD-10-CM | POA: Diagnosis not present

## 2024-02-01 DIAGNOSIS — H35033 Hypertensive retinopathy, bilateral: Secondary | ICD-10-CM | POA: Diagnosis not present

## 2024-02-02 DIAGNOSIS — Z780 Asymptomatic menopausal state: Secondary | ICD-10-CM | POA: Diagnosis not present

## 2024-02-02 LAB — HM DEXA SCAN: HM Dexa Scan: NORMAL

## 2024-02-04 ENCOUNTER — Encounter: Payer: Self-pay | Admitting: Internal Medicine

## 2024-02-08 ENCOUNTER — Ambulatory Visit: Payer: Self-pay | Admitting: Internal Medicine

## 2024-02-09 ENCOUNTER — Encounter: Payer: Self-pay | Admitting: Internal Medicine

## 2024-02-09 ENCOUNTER — Ambulatory Visit: Payer: Self-pay | Admitting: Internal Medicine

## 2024-02-09 VITALS — BP 110/80 | HR 82 | Temp 98.3°F | Ht 63.0 in | Wt 182.4 lb

## 2024-02-09 DIAGNOSIS — I10 Essential (primary) hypertension: Secondary | ICD-10-CM

## 2024-02-09 DIAGNOSIS — E1169 Type 2 diabetes mellitus with other specified complication: Secondary | ICD-10-CM | POA: Diagnosis not present

## 2024-02-09 DIAGNOSIS — E785 Hyperlipidemia, unspecified: Secondary | ICD-10-CM | POA: Diagnosis not present

## 2024-02-09 NOTE — Progress Notes (Signed)
 I,Christina Anthony, CMA,acting as a neurosurgeon for Christina LOISE Slocumb, MD.,have documented all relevant documentation on the behalf of Christina LOISE Slocumb, MD,as directed by  Christina LOISE Slocumb, MD while in the presence of Christina LOISE Slocumb, MD.  Subjective:  Patient ID: Christina Anthony , female    DOB: 1955/03/21 , 68 y.o.   MRN: [id number]  Chief Complaint  Patient presents with   Hyperlipidemia    She presents today for chol check.  She was started on rosuvastatin  10mg  daily at last visit. No new concerns.      HPI Discussed the use of AI scribe software for clinical note transcription with the patient, who gave verbal consent to proceed.  History of Present Illness Christina Anthony is a 68 year old female who presents for routine follow-up and cholesterol check.  She has recently completed several diagnostic tests, including a CT cardiology scan and a bone density test, both of which returned normal results. A calcium  score of three was noted.  Her current medication regimen includes Farxiga  10 mg for diabetes management, rosuvastatin  10 mg for cholesterol control, and Maxidex for blood pressure management. She maintains her exercise routine, which is beneficial for her bone health.  She has an upcoming appointment in February for a diabetes check and will be doing labs for endocrinology, specifically for thyroid  evaluation with Dr. More. She also mentions having made an appointment with her gynecologist, Dr. Arzella, but is unsure of the date and plans to confirm it.    Past Medical History:  Diagnosis Date   Chronic uveitis of both eyes    Diabetes mellitus without complication (HCC)    GERD (gastroesophageal reflux disease)    no meds currently   Glaucoma (increased eye pressure)    Goiter    Hypertension      Family History  Problem Relation Age of Onset   Diabetes Mother    Hypertension Mother    Glaucoma Mother    Hypertension Father    Cancer Sister    Breast cancer Neg  Hx      Current Outpatient Medications:    benzonatate  (TESSALON  PERLES) 100 MG capsule, Take 1 capsule (100 mg total) by mouth 3 (three) times daily as needed for cough., Disp: 30 capsule, Rfl: 1   Biotin (BIOTIN MAXIMUM STRENGTH) 10 MG TABS, Take 1 capsule by mouth daily., Disp: , Rfl:    Blood Glucose Monitoring Suppl (CONTOUR BLOOD GLUCOSE SYSTEM) w/Device KIT, USE ONCE DAILY TO CHECK BLOOD SUGARS., Disp: 1 kit, Rfl: 0   Blood Glucose Monitoring Suppl DEVI, May substitute to any manufacturer covered by patient's insurance., Disp: 1 each, Rfl: 0   brimonidine (ALPHAGAN) 0.2 % ophthalmic solution, 1 drop 3 (three) times daily. 1 drop tid right eye, bid left eye, Disp: , Rfl:    cetirizine (ZYRTEC) 10 MG tablet, Take 10 mg by mouth daily., Disp: , Rfl:    Cholecalciferol (VITAMIN D3) 25 MCG (1000 UT) CAPS, Take by mouth., Disp: , Rfl:    clobetasol  (TEMOVATE ) 0.05 % external solution, Apply 1 Application topically at bedtime., Disp: 50 mL, Rfl: 1   dorzolamide-timolol (COSOPT) 22.3-6.8 MG/ML ophthalmic solution, Place 1 drop into both eyes 2 (two) times daily., Disp: , Rfl:    FARXIGA  10 MG TABS tablet, TAKE 1 TABLET BY MOUTH EVERY DAY, Disp: 90 tablet, Rfl: 1   Folic Acid 5 MG CAPS, daily. , Disp: , Rfl:    methotrexate (RHEUMATREX) 2.5 MG tablet, 2.5 mg. 8  tabs weekly, Disp: , Rfl:    Multiple Vitamins-Minerals (CENTRUM SILVER 50+WOMEN) TABS, Take by mouth., Disp: , Rfl:    Omega-3 Fatty Acids (FISH OIL PO), Take by mouth., Disp: , Rfl:    omeprazole (PRILOSEC) 20 MG capsule, Take by mouth., Disp: , Rfl:    ONETOUCH ULTRA test strip, 1 EACH BY IN VITRO ROUTE IN THE MORNING, AT NOON, AND AT BEDTIME., Disp: 100 strip, Rfl: 0   rosuvastatin  (CRESTOR ) 10 MG tablet, Take 1 tablet (10 mg total) by mouth daily., Disp: 90 tablet, Rfl: 2   Safety Seal Miscellaneous MISC, Apply 1 Application topically in the morning. Medication name: hormonic hair solution (minoxidil USP 7% finasteride USP 0.05%  clobetasol  0.05%), Disp: 30 mL, Rfl: 10   triamterene -hydrochlorothiazide (MAXZIDE-25) 37.5-25 MG tablet, TAKE 1 TABLET BY MOUTH EVERY DAY, Disp: 90 tablet, Rfl: 2   TURMERIC PO, Take 1 capsule by mouth daily., Disp: , Rfl:    No Known Allergies   Review of Systems  Constitutional: Negative.   Respiratory: Negative.    Cardiovascular: Negative.   Gastrointestinal: Negative.   Neurological: Negative.   Psychiatric/Behavioral: Negative.       Today's Vitals   02/09/24 1426  BP: 110/80  Pulse: 82  Temp: 98.3 F (36.8 C)  SpO2: 98%  Weight: 182 lb 6.4 oz (82.7 kg)  Height: 5' 3 (1.6 m)   Body mass index is 32.31 kg/m.  Wt Readings from Last 3 Encounters:  02/09/24 182 lb 6.4 oz (82.7 kg)  12/23/23 180 lb (81.6 kg)  12/16/23 181 lb (82.1 kg)    The 10-year ASCVD risk score (Arnett DK, et al., 2019) is: 13%   Values used to calculate the score:     Age: 3 years     Clinically relevant sex: Female     Is Non-Hispanic African American: Yes     Diabetic: Yes     Tobacco smoker: No     Systolic Blood Pressure: 110 mmHg     Is BP treated: Yes     HDL Cholesterol: 54 mg/dL     Total Cholesterol: 148 mg/dL  Objective:  Physical Exam Vitals and nursing note reviewed.  Constitutional:      Appearance: Normal appearance.  HENT:     Head: Normocephalic and atraumatic.  Eyes:     Extraocular Movements: Extraocular movements intact.  Cardiovascular:     Rate and Rhythm: Normal rate and regular rhythm.     Heart sounds: Normal heart sounds.  Pulmonary:     Effort: Pulmonary effort is normal.     Breath sounds: Normal breath sounds.  Musculoskeletal:     Cervical back: Normal range of motion.  Skin:    General: Skin is warm.  Neurological:     General: No focal deficit present.     Mental Status: She is alert.  Psychiatric:        Mood and Affect: Mood normal.        Behavior: Behavior normal.         Assessment And Plan:   Assessment & Plan Dyslipidemia  associated with type 2 diabetes mellitus (HCC) Chronic, Ldl goal is less than 70.  Managed with rosuvastatin  10 mg. Recent CT cardiology scan showed a calcium  score of 3. - Continue rosuvastatin  10 mg for cholesterol management. - Continue with Farxiga  10mg  daily for DM.  Essential hypertension, benign Chronic, fair control.  Plan to switch diuretic to triamterene  to better protect kidneys. Current kidney function is stable. -  Switch Maxzide to valsartan or another ARB after current supply is finished. - Follow low sodium diet.    Orders Placed This Encounter  Procedures   Lipid panel   ALT     Return if symptoms worsen or fail to improve.  Patient was given opportunity to ask questions. Patient verbalized understanding of the plan and was able to repeat key elements of the plan. All questions were answered to their satisfaction.    I, Christina LOISE Slocumb, MD, have reviewed all documentation for this visit. The documentation on 02/09/24 for the exam, diagnosis, procedures, and orders are all accurate and complete.   IF YOU HAVE BEEN REFERRED TO A SPECIALIST, IT MAY TAKE 1-2 WEEKS TO SCHEDULE/PROCESS THE REFERRAL. IF YOU HAVE NOT HEARD FROM US /SPECIALIST IN TWO WEEKS, PLEASE GIVE US  A CALL AT [phone].

## 2024-02-09 NOTE — Patient Instructions (Signed)

## 2024-02-10 LAB — LIPID PANEL
Chol/HDL Ratio: 2.7 ratio (ref 0.0–4.4)
Cholesterol, Total: 148 mg/dL (ref 100–199)
HDL: 54 mg/dL (ref >39)
LDL Chol Calc (NIH): 73 mg/dL (ref 0–99)
Triglycerides: 119 mg/dL (ref 0–149)
VLDL Cholesterol Cal: 21 mg/dL (ref 5–40)

## 2024-02-10 LAB — ALT: ALT: 20 IU/L (ref 0–32)

## 2024-02-12 ENCOUNTER — Ambulatory Visit: Payer: Self-pay | Admitting: Internal Medicine

## 2024-02-12 ENCOUNTER — Other Ambulatory Visit: Payer: Self-pay | Admitting: Internal Medicine

## 2024-02-13 NOTE — Assessment & Plan Note (Signed)
 Chronic, Ldl goal is less than 70.  Managed with rosuvastatin  10 mg. Recent CT cardiology scan showed a calcium  score of 3. - Continue rosuvastatin  10 mg for cholesterol management. - Continue with Farxiga  10mg  daily for DM.

## 2024-02-13 NOTE — Assessment & Plan Note (Signed)
 Chronic, fair control.  Plan to switch diuretic to triamterene  to better protect kidneys. Current kidney function is stable. - Switch Maxzide to valsartan or another ARB after current supply is finished. - Follow low sodium diet.

## 2024-04-06 ENCOUNTER — Other Ambulatory Visit: Payer: Self-pay

## 2024-04-19 ENCOUNTER — Other Ambulatory Visit

## 2024-04-24 ENCOUNTER — Ambulatory Visit: Payer: Self-pay | Admitting: Internal Medicine

## 2024-04-26 ENCOUNTER — Ambulatory Visit: Admitting: "Endocrinology

## 2024-07-25 ENCOUNTER — Ambulatory Visit: Admitting: Dermatology

## 2024-09-27 ENCOUNTER — Ambulatory Visit: Payer: Self-pay

## 2024-12-20 ENCOUNTER — Encounter: Payer: Self-pay | Admitting: Internal Medicine
